# Patient Record
Sex: Male | Born: 1980 | Race: White | Hispanic: No | Marital: Married | Smoking: Never smoker
Health system: Southern US, Community
[De-identification: ages and names within clinical notes are randomized; demographics above are authoritative.]

## PROBLEM LIST (undated history)

## (undated) DIAGNOSIS — M25569 Pain in unspecified knee: Principal | ICD-10-CM

## (undated) DIAGNOSIS — G8929 Other chronic pain: Secondary | ICD-10-CM

## (undated) DIAGNOSIS — F419 Anxiety disorder, unspecified: Secondary | ICD-10-CM

## (undated) DIAGNOSIS — M542 Cervicalgia: Secondary | ICD-10-CM

## (undated) DIAGNOSIS — R200 Anesthesia of skin: Secondary | ICD-10-CM

## (undated) DIAGNOSIS — F1121 Opioid dependence, in remission: Secondary | ICD-10-CM

## (undated) HISTORY — DX: Anesthesia of skin: R20.0

## (undated) HISTORY — PX: KNEE SURGERY: SHX244

## (undated) HISTORY — DX: Pain in unspecified knee: M25.569

## (undated) HISTORY — DX: Cervicalgia: M54.2

## (undated) HISTORY — DX: Opioid dependence, in remission: F11.21

## (undated) HISTORY — DX: Anxiety disorder, unspecified: F41.9

## (undated) HISTORY — DX: Other chronic pain: G89.29

## (undated) HISTORY — PX: INNER EAR SURGERY: SHX679

---

## 1999-03-09 ENCOUNTER — Emergency Department (HOSPITAL_COMMUNITY): Admission: EM | Admit: 1999-03-09 | Discharge: 1999-03-09 | Payer: Self-pay

## 2001-10-15 ENCOUNTER — Ambulatory Visit: Admission: RE | Admit: 2001-10-15 | Discharge: 2001-10-15 | Payer: Self-pay | Admitting: Family Medicine

## 2001-10-16 ENCOUNTER — Ambulatory Visit (HOSPITAL_COMMUNITY): Admission: RE | Admit: 2001-10-16 | Discharge: 2001-10-16 | Payer: Self-pay | Admitting: Family Medicine

## 2004-10-15 ENCOUNTER — Emergency Department (HOSPITAL_COMMUNITY): Admission: EM | Admit: 2004-10-15 | Discharge: 2004-10-15 | Payer: Self-pay | Admitting: Emergency Medicine

## 2004-12-25 ENCOUNTER — Ambulatory Visit (HOSPITAL_COMMUNITY): Admission: RE | Admit: 2004-12-25 | Discharge: 2004-12-25 | Payer: Self-pay | Admitting: Neurosurgery

## 2006-04-02 ENCOUNTER — Emergency Department (HOSPITAL_COMMUNITY): Admission: EM | Admit: 2006-04-02 | Discharge: 2006-04-02 | Payer: Self-pay | Admitting: Family Medicine

## 2006-10-31 ENCOUNTER — Emergency Department (HOSPITAL_COMMUNITY): Admission: EM | Admit: 2006-10-31 | Discharge: 2006-10-31 | Payer: Self-pay | Admitting: Family Medicine

## 2009-09-02 ENCOUNTER — Emergency Department (HOSPITAL_COMMUNITY): Admission: EM | Admit: 2009-09-02 | Discharge: 2009-09-02 | Payer: Self-pay | Admitting: Emergency Medicine

## 2009-09-02 ENCOUNTER — Emergency Department (HOSPITAL_COMMUNITY): Admission: EM | Admit: 2009-09-02 | Discharge: 2009-09-03 | Payer: Self-pay | Admitting: Emergency Medicine

## 2010-09-25 ENCOUNTER — Ambulatory Visit
Admission: RE | Admit: 2010-09-25 | Discharge: 2010-09-25 | Disposition: A | Discharge status: Home / Self Care | Payer: Self-pay | Source: Ambulatory Visit | Attending: Pain Medicine | Admitting: Pain Medicine

## 2010-09-25 ENCOUNTER — Other Ambulatory Visit: Payer: Self-pay | Admitting: Pain Medicine

## 2010-09-25 DIAGNOSIS — M25569 Pain in unspecified knee: Secondary | ICD-10-CM

## 2011-06-03 ENCOUNTER — Ambulatory Visit (INDEPENDENT_AMBULATORY_CARE_PROVIDER_SITE_OTHER): Payer: BC Managed Care – PPO | Admitting: Family Medicine

## 2011-06-03 ENCOUNTER — Encounter: Payer: Self-pay | Admitting: Family Medicine

## 2011-06-03 VITALS — BP 126/82 | HR 74 | Temp 98.0°F | Ht 69.5 in | Wt 178.0 lb

## 2011-06-03 DIAGNOSIS — F411 Generalized anxiety disorder: Secondary | ICD-10-CM

## 2011-06-03 DIAGNOSIS — M25569 Pain in unspecified knee: Secondary | ICD-10-CM

## 2011-06-03 DIAGNOSIS — G8929 Other chronic pain: Secondary | ICD-10-CM | POA: Insufficient documentation

## 2011-06-03 DIAGNOSIS — F419 Anxiety disorder, unspecified: Secondary | ICD-10-CM | POA: Insufficient documentation

## 2011-06-03 MED ORDER — METHADONE HCL 10 MG/5ML PO SOLN: 120.0000 mg | Freq: Every day | ORAL | Status: AC

## 2011-06-03 NOTE — Progress Notes (Signed)
  Subjective:    Patient ID: Luke [name], male    DOB: Feb 01, 1981, 31 y.o.   MRN: [id number]  HPI 31 yr old male to establish with [place] who asks advice about his chronic knee pain. He played soccer while attending [employer], and he sustained some major injuries to the left knee. He had torn cartilage and some sort of fracture that left bone fragments in the joint space. He had 2 surgeries per Dr. Eulah Pont to repair the cartilage tears and to remove the bone fragments. This was only partly successful, however, and he has dealt with chronic severe pain in the knee ever since. He saw several different orthopedic doctors and several pain specialists, but nothing worked very well. He ended up at the [facility] clinic, and he has been going there every month for methadone for the past 7 years. He gets reasonable pain control with this, but he cannot afford the $400 a month fee that he must pay to attend this clinic. He must pay cash for this. He asks if we can suggest a place to go where he could use his insurance.    Review of Systems  Constitutional: Negative.   Respiratory: Negative.   Cardiovascular: Negative.   Musculoskeletal: Positive for arthralgias.       Objective:   Physical Exam  Constitutional: He appears well-developed and well-nourished.  Neck: No thyromegaly present.  Cardiovascular: Normal rate, regular rhythm, normal heart sounds and intact distal pulses.   Pulmonary/Chest: Effort normal and breath sounds normal.  Musculoskeletal: Normal range of motion. He exhibits no edema and no tenderness.  Lymphadenopathy:    He has no cervical adenopathy.          Assessment & Plan:  Chronic knee pain which has required the use of methadone for some time. We will try to get him into another pain clinic that can help him at a lower cost.

## 2011-07-29 ENCOUNTER — Telehealth: Payer: Self-pay | Admitting: Family Medicine

## 2011-07-29 DIAGNOSIS — G8929 Other chronic pain: Secondary | ICD-10-CM

## 2011-07-29 NOTE — Telephone Encounter (Signed)
Patient called stating that he was told by the MD if the PM clinic did not prescribed the same med he was on to call back and he could be referred elsewhere. Patient would like to be referred to a different clinic. Please assist.

## 2011-07-30 NOTE — Telephone Encounter (Signed)
Another referral was done, Luke [name] will call him

## 2011-07-31 NOTE — Telephone Encounter (Signed)
Spoke with pt

## 2012-04-19 ENCOUNTER — Telehealth: Payer: Self-pay

## 2012-04-19 NOTE — Telephone Encounter (Signed)
Millie rn advised

## 2012-04-19 NOTE — Telephone Encounter (Signed)
Not my pt.Luke [name] it is Dr. Claris Che pt. Have [facility] know to contact them.  I will forward this as well.

## 2012-04-19 NOTE — Telephone Encounter (Signed)
Millie RN at Dr Sherre Lain with [facility] treatment center left v/m; has Dr Ermalene Searing as listed as prescribing Alprazolam for pt. Pt gets Methadone at [facility] treatment center. Millie request if Dr Ermalene Searing is continuing to order Alprazolam to consider changing to different med for anxiety; Benzos are not compatible with Methadone and on occasion can cause death. Request call back between 5 am - 2 pm.`

## 2012-04-19 NOTE — Telephone Encounter (Signed)
Noted. I have never given him Alprazolam either, nor will I if he asks me to. He can follow up prn

## 2013-09-23 ENCOUNTER — Ambulatory Visit (INDEPENDENT_AMBULATORY_CARE_PROVIDER_SITE_OTHER): Payer: BC Managed Care – PPO | Admitting: Family Medicine

## 2013-09-23 VITALS — BP 120/82 | HR 58 | Temp 97.9°F | Resp 14 | Ht 68.0 in | Wt 164.8 lb

## 2013-09-23 DIAGNOSIS — L237 Allergic contact dermatitis due to plants, except food: Secondary | ICD-10-CM

## 2013-09-23 DIAGNOSIS — L255 Unspecified contact dermatitis due to plants, except food: Secondary | ICD-10-CM

## 2013-09-23 MED ORDER — PREDNISONE 20 MG PO TABS: ORAL_TABLET | ORAL | Status: DC

## 2013-09-23 MED ORDER — METHYLPREDNISOLONE ACETATE 80 MG/ML IJ SUSP
80.0000 mg | Freq: Once | INTRAMUSCULAR | Status: AC
  Administered 2013-09-23: 80 mg via INTRAMUSCULAR

## 2013-09-23 MED ORDER — TRIAMCINOLONE ACETONIDE 0.1 % EX CREA: 1.0000 "application " | TOPICAL_CREAM | Freq: Two times a day (BID) | CUTANEOUS | Status: DC

## 2013-09-23 NOTE — Patient Instructions (Signed)
Wash well  Take over-the-counter Zyrtec for itching  Use topical triamcinolone cream on the bed areas  Take prednisone 3 tablets daily for 2 days, 2 daily for 2 days, then one daily for 2 days  Return if further concerns

## 2013-09-23 NOTE — Progress Notes (Signed)
Subjective: 33 year old man who works at [employer]. Thursday morning he was working outside at home. He now set has poison ivy breaking out on both forearms. It is not gone elsewhere. He is otherwise healthy. He takes Xanax. He is not diabetic.  Objective: Confluent areas of contact dermatitis on his forearms, especially the distal half of the forearm. On the left side there is a little raised bump area in the middle of the erythematous area. He wondered whether he got an air.  Assessment: Contact dermatitis  Plan: Wash well Depo-Medrol 80 Prednisone 20 mg, 3, 3, 2, 2, 1, 1 Zyrtec as needed for itch

## 2014-03-15 ENCOUNTER — Other Ambulatory Visit (INDEPENDENT_AMBULATORY_CARE_PROVIDER_SITE_OTHER): Payer: BC Managed Care – PPO

## 2014-03-15 DIAGNOSIS — Z0000 Encounter for general adult medical examination without abnormal findings: Secondary | ICD-10-CM

## 2014-03-15 LAB — BASIC METABOLIC PANEL
BUN: 24 mg/dL — ABNORMAL HIGH (ref 6–23)
CHLORIDE: 104 meq/L (ref 96–112)
CO2: 26 mEq/L (ref 19–32)
Calcium: 9.7 mg/dL (ref 8.4–10.5)
Creatinine, Ser: 1.1 mg/dL (ref 0.4–1.5)
GFR: 82.1 mL/min (ref >60.00)
Glucose, Bld: 86 mg/dL (ref 70–99)
POTASSIUM: 4.4 meq/L (ref 3.5–5.1)
Sodium: 140 mEq/L (ref 135–145)

## 2014-03-15 LAB — HEPATIC FUNCTION PANEL
ALT: 23 U/L (ref 0–53)
AST: 25 U/L (ref 0–37)
Albumin: 4.6 g/dL (ref 3.5–5.2)
Alkaline Phosphatase: 41 U/L (ref 39–117)
BILIRUBIN DIRECT: 0 mg/dL (ref 0.0–0.3)
BILIRUBIN TOTAL: 0.7 mg/dL (ref 0.2–1.2)
TOTAL PROTEIN: 7.7 g/dL (ref 6.0–8.3)

## 2014-03-15 LAB — POCT URINALYSIS DIPSTICK
BILIRUBIN UA: NEGATIVE
Glucose, UA: NEGATIVE
KETONES UA: NEGATIVE
LEUKOCYTES UA: NEGATIVE
Nitrite, UA: NEGATIVE
Protein, UA: NEGATIVE
RBC UA: NEGATIVE
Spec Grav, UA: 1.015
Urobilinogen, UA: 0.2
pH, UA: 6

## 2014-03-15 LAB — CBC WITH DIFFERENTIAL/PLATELET
BASOS PCT: 0.4 % (ref 0.0–3.0)
Basophils Absolute: 0 10*3/uL (ref 0.0–0.1)
Eosinophils Absolute: 0.2 10*3/uL (ref 0.0–0.7)
Eosinophils Relative: 3 % (ref 0.0–5.0)
HEMATOCRIT: 43.2 % (ref 39.0–52.0)
Hemoglobin: 14.5 g/dL (ref 13.0–17.0)
LYMPHS ABS: 2.1 10*3/uL (ref 0.7–4.0)
Lymphocytes Relative: 36.6 % (ref 12.0–46.0)
MCHC: 33.5 g/dL (ref 30.0–36.0)
MCV: 99 fl (ref 78.0–100.0)
MONO ABS: 0.5 10*3/uL (ref 0.1–1.0)
Monocytes Relative: 8.7 % (ref 3.0–12.0)
NEUTROS PCT: 51.3 % (ref 43.0–77.0)
Neutro Abs: 2.9 10*3/uL (ref 1.4–7.7)
Platelets: 235 10*3/uL (ref 150.0–400.0)
RBC: 4.37 Mil/uL (ref 4.22–5.81)
RDW: 13.2 % (ref 11.5–15.5)
WBC: 5.7 10*3/uL (ref 4.0–10.5)

## 2014-03-15 LAB — LIPID PANEL
CHOL/HDL RATIO: 3
Cholesterol: 134 mg/dL (ref 0–200)
HDL: 51.6 mg/dL (ref >39.00)
LDL CALC: 71 mg/dL (ref 0–99)
NonHDL: 82.4
Triglycerides: 59 mg/dL (ref 0.0–149.0)
VLDL: 11.8 mg/dL (ref 0.0–40.0)

## 2014-03-15 LAB — TSH: TSH: 1.92 u[IU]/mL (ref 0.35–4.50)

## 2014-03-21 ENCOUNTER — Ambulatory Visit (INDEPENDENT_AMBULATORY_CARE_PROVIDER_SITE_OTHER): Payer: BC Managed Care – PPO | Admitting: Family Medicine

## 2014-03-21 ENCOUNTER — Encounter: Payer: Self-pay | Admitting: Family Medicine

## 2014-03-21 VITALS — BP 143/72 | HR 69 | Temp 97.8°F | Ht 68.0 in | Wt 160.0 lb

## 2014-03-21 DIAGNOSIS — Z0000 Encounter for general adult medical examination without abnormal findings: Secondary | ICD-10-CM

## 2014-03-21 NOTE — Progress Notes (Signed)
Pre visit review using our clinic review tool, if applicable. No additional management support is needed unless otherwise documented below in the visit note. 

## 2014-03-21 NOTE — Progress Notes (Signed)
   Subjective:    Patient ID: Luke [name], male    DOB: [birth year], 33 y.o.   MRN: [id number]  HPI 33 yr old male for a cpx. He feels well except he does describe some intermittent aches and pains in the right arm and right leg. These comes 2 or 3 times a week and last only minutes at a time. No swelling or numbness or weakness. No neurologic changes. He sees Dr. Evelene CroonKaur for management of his anxiety and she has helped him wean off narcotic meds. He is totally off Methadone now and is taking Zubsolv instead. He uses a combination of Librium and Xanax for the anxiety.   Review of Systems  Constitutional: Negative.   HENT: Negative.   Eyes: Negative.   Respiratory: Negative.   Cardiovascular: Negative.   Gastrointestinal: Negative.   Genitourinary: Negative.   Musculoskeletal: Positive for myalgias. Negative for back pain, joint swelling, arthralgias, gait problem, neck pain and neck stiffness.  Skin: Negative.   Neurological: Negative.   Psychiatric/Behavioral: Negative for suicidal ideas, hallucinations, behavioral problems, confusion, sleep disturbance, self-injury, dysphoric mood, decreased concentration and agitation. The patient is nervous/anxious. The patient is not hyperactive.        Objective:   Physical Exam  Constitutional: He is oriented to person, place, and time. He appears well-developed and well-nourished. No distress.  HENT:  Head: Normocephalic and atraumatic.  Right Ear: External ear normal.  Left Ear: External ear normal.  Nose: Nose normal.  Mouth/Throat: Oropharynx is clear and moist. No oropharyngeal exudate.  Eyes: Conjunctivae and EOM are normal. Pupils are equal, round, and reactive to light. Right eye exhibits no discharge. Left eye exhibits no discharge. No scleral icterus.  Neck: Neck supple. No JVD present. No tracheal deviation present. No thyromegaly present.  Cardiovascular: Normal rate, regular rhythm, normal heart sounds and intact distal pulses.   Exam reveals no gallop and no friction rub.   No murmur heard. Pulmonary/Chest: Effort normal and breath sounds normal. No respiratory distress. He has no wheezes. He has no rales. He exhibits no tenderness.  Abdominal: Soft. Bowel sounds are normal. He exhibits no distension and no mass. There is no tenderness. There is no rebound and no guarding.  Genitourinary: Rectum normal, prostate normal and penis normal. Guaiac negative stool. No penile tenderness.  Musculoskeletal: Normal range of motion. He exhibits no edema or tenderness.  Lymphadenopathy:    He has no cervical adenopathy.  Neurological: He is alert and oriented to person, place, and time. He has normal reflexes. No cranial nerve deficit. He exhibits normal muscle tone. Coordination normal.  Skin: Skin is warm and dry. No rash noted. He is not diaphoretic. No erythema. No pallor.  Psychiatric: He has a normal mood and affect. His behavior is normal. Judgment and thought content normal.          Assessment & Plan:  Well exam. I am not sure how to explain his intermittent unilateral myalgias. Try taking Aleve prn.

## 2014-04-19 ENCOUNTER — Telehealth: Payer: Self-pay | Admitting: Family Medicine

## 2014-04-19 DIAGNOSIS — R0981 Nasal congestion: Secondary | ICD-10-CM

## 2014-04-19 NOTE — Telephone Encounter (Signed)
I left message with this information. 

## 2014-04-19 NOTE — Telephone Encounter (Signed)
Referral was done  

## 2014-04-19 NOTE — Telephone Encounter (Signed)
Wife called to ask for if pt could get a referral to an ENT. She states pt has trouble breathing at night and was told he would need surgery to help w/ this issue.   She feels like ENT is the best way to go.  Does pt need referral to ent?

## 2014-05-14 ENCOUNTER — Encounter: Payer: Self-pay | Admitting: Family Medicine

## 2014-05-14 ENCOUNTER — Ambulatory Visit (INDEPENDENT_AMBULATORY_CARE_PROVIDER_SITE_OTHER): Payer: BLUE CROSS/BLUE SHIELD | Admitting: Family Medicine

## 2014-05-14 VITALS — BP 135/74 | HR 57 | Temp 98.3°F | Ht 68.0 in | Wt 156.0 lb

## 2014-05-14 DIAGNOSIS — M542 Cervicalgia: Secondary | ICD-10-CM

## 2014-05-14 NOTE — Progress Notes (Signed)
Pre visit review using our clinic review tool, if applicable. No additional management support is needed unless otherwise documented below in the visit note. 

## 2014-05-14 NOTE — Progress Notes (Signed)
   Subjective:    Patient ID: Luke [name], male    DOB: 11/02/1980, 34 y.o.   MRN: [id number]  HPI Here to follow up on a 2 month hx of intermittent weakness and feeling "cold" in the right arm and right leg. There is no pain per se. He does have some pain and stiffness in the right side of the neck and upper back.    Review of Systems  Musculoskeletal: Positive for neck pain and neck stiffness. Negative for gait problem.  Neurological: Positive for weakness and numbness. Negative for dizziness, tremors, seizures, syncope, facial asymmetry, speech difficulty, light-headedness and headaches.       Objective:   Physical Exam  Constitutional: He appears well-developed and well-nourished.  Musculoskeletal:  Mildly tender in the right neck and trapezeus area. Full ROM          Assessment & Plan:  He seems to have tight muscles in the neck which are impinging on cervical nerves. We will set up some PT for him

## 2014-05-17 ENCOUNTER — Ambulatory Visit: Payer: Self-pay | Admitting: Family Medicine

## 2014-06-07 ENCOUNTER — Ambulatory Visit: Payer: BLUE CROSS/BLUE SHIELD | Admitting: Physical Therapy

## 2014-07-05 ENCOUNTER — Encounter: Payer: Self-pay | Admitting: Neurology

## 2014-07-05 ENCOUNTER — Ambulatory Visit (INDEPENDENT_AMBULATORY_CARE_PROVIDER_SITE_OTHER): Payer: BLUE CROSS/BLUE SHIELD | Admitting: Neurology

## 2014-07-05 VITALS — BP 152/78 | HR 80 | Resp 18 | Ht 69.0 in | Wt 151.0 lb

## 2014-07-05 DIAGNOSIS — R202 Paresthesia of skin: Secondary | ICD-10-CM | POA: Diagnosis not present

## 2014-07-05 DIAGNOSIS — M791 Myalgia, unspecified site: Secondary | ICD-10-CM

## 2014-07-05 DIAGNOSIS — M79601 Pain in right arm: Secondary | ICD-10-CM

## 2014-07-05 DIAGNOSIS — M79604 Pain in right leg: Secondary | ICD-10-CM | POA: Diagnosis not present

## 2014-07-05 DIAGNOSIS — J310 Chronic rhinitis: Secondary | ICD-10-CM | POA: Diagnosis not present

## 2014-07-05 DIAGNOSIS — R0981 Nasal congestion: Secondary | ICD-10-CM

## 2014-07-05 DIAGNOSIS — T485X5A Adverse effect of other anti-common-cold drugs, initial encounter: Secondary | ICD-10-CM

## 2014-07-05 NOTE — Patient Instructions (Signed)
Your neurological exam looks reassuringly good. We will look further for a cause.  We will do blood today and call you back.  We will schedule you for a neck MRI and call you with the results.  We will consider Physical therapy down the road and if needed, do a brain scan if needed.

## 2014-07-05 NOTE — Progress Notes (Signed)
Subjective:    Patient ID: Luke [name] is a 34 y.o. male.  HPI     Huston Foley, MD, PhD [facility] [address], [address] [address] [place], [place] [address]  Dear Dr. Renne Crigler,   I saw your patient, Luke [name], upon your kind request in my neurologic clinic today for initial consultation of his right-sided numbness. The patient is unaccompanied today. As you know, Luke [name] is a 34 year old right-handed gentleman with an underlying medical history of anxiety and narcotic pain medication addiction, previously on methadone, now on Zubsolv and followed by Dr. Evelene Croon in psychiatry, who reports an approximately four-month history of right-sided numbness affecting his upper and lower extremity along with tingling, no weakness. He has had some neck pain and also pain radiating to his arm and right leg. He has no left-sided symptoms. He has not noticed any fine motor dyscontrol. He denies any headaches. He has no facial symptoms with the exception of right jaw tingling one time or so. Symptoms started gradually. He recalls that symptoms started after he came off of methadone. He's not sure if that is connected. He has no family history of neuromuscular diseases. He does not believe his symptoms are progressive. He has seen Dr. Clent Ridges for this at [facility] primary care as well. I reviewed your office note from 06/21/2014 which you kindly included as well as Dr. Claris Che office notes from 03/21/2014 as well as 05/14/2014. He was referred to physical therapy. He canceled his physical therapy appointment on 06/07/2014. He has not had any recent neck imaging studies. He had a cervical spine MRI without contrast on 12/29/2004 which I reviewed: IMPRESSION:   1. 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   2. No evidence of vertebral body compression fracture, subluxation or cord injury.    In addition, personally reviewed the  images through the PACS system. Upon my review there were minimal degenerative changes, mild disc bulge at C5-6. Of note, he had a head CT without contrast for her complaint of right-sided weakness on 09/03/2009 which I reviewed: This was reported as normal. In addition, personally reviewed the images through the PACS system and findings were negative for any acute changes upon my review. He had blood work on 03/15/2014 which I reviewed: Lipid profile was unremarkable, CBC and CMP were unremarkable, TSH was normal.  His Past Medical History Is Significant For: Past Medical History  Diagnosis Date  . Anxiety     sees Dr. Milagros Evener   . Chronic knee pain     sees [employer] (methadone clinic)  . Neck pain   . Numbness on right side   . History of narcotic addiction     His Past Surgical History Is Significant For: Past Surgical History  Procedure Laterality Date  . Knee surgery  1999 and 2000    per Dr. Richardson Landry     His Family History Is Significant For: Family History  Problem Relation Age of Onset  . Depression Father   . Bipolar disorder Mother     His Social History Is Significant For: History   Social History  . Marital Status: Single    Spouse Name: N/A  . Number of Children: 1  . Years of Education: Some colle   Occupational History  . 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 Smoking status: Never Smoker   . Smokeless tobacco: Never Used  .  Alcohol Use: 0.0 oz/week    0 Standard drinks or equivalent per week     Comment: rare  . Drug Use: No  . Sexual Activity: Not on file   Other Topics Concern  . None   Social History Narrative   May have 1 energy drink during day     His Allergies Are:  No Known Allergies:   His Current Medications Are:  Outpatient Encounter Prescriptions as of 07/05/2014  Medication Sig  . ALPRAZolam (XANAX) 1 MG tablet Take 1 mg by mouth daily.   . Buprenorphine HCl-Naloxone HCl (ZUBSOLV) 5.7-1.4 MG SUBL Place  under the tongue 2 (two) times daily.  . chlordiazePOXIDE (LIBRIUM) 25 MG capsule Take 25 mg by mouth 3 (three) times daily as needed for anxiety.  . [DISCONTINUED] ZUBSOLV 11.4-2.9 MG SUBL   :   Review of Systems:  Out of a complete 14 point review of systems, all are reviewed and negative with the exception of these symptoms as listed below:   Review of Systems  Constitutional:       Weight loss  Musculoskeletal:       Aching muscles  Neurological: Positive for weakness.       "Tingling" from R side of neck down into R arm and R leg, started about 3 months ago and seems to get worse with stress  Psychiatric/Behavioral:       Anxiety    Objective:  Neurologic Exam  Physical Exam Physical Examination:   Filed Vitals:   07/05/14 1432  BP: 152/78  Pulse: 80  Resp: 18   General Examination: The patient is a very pleasant 34 y.o. male in no acute distress. He appears well-developed and well-nourished and well groomed. He is mildly anxious appearing.  HEENT: Normocephalic, atraumatic, pupils are equal, round and reactive to light and accommodation. Funduscopic exam is normal with sharp disc margins noted. Extraocular tracking is good without limitation to gaze excursion or nystagmus noted. Normal smooth pursuit is noted. Hearing is grossly intact. Tympanic membranes are clear bilaterally. Face is symmetric with normal facial animation and normal facial sensation. Speech is clear with no dysarthria noted. There is no hypophonia. There is no lip, neck/head, jaw or voice tremor. Neck is supple with full range of passive and active motion. There are no carotid bruits on auscultation. Oropharynx exam reveals: mild mouth dryness, adequate dental hygiene and no significant airway crowding, but he has mild pharyngeal erythema. Speech is nasal sounding. Nasal inspection reveals normal septum deviation and nasal congestion. He does admit to using Afrin daily, more than once a day.   Chest: Clear  to auscultation without wheezing, rhonchi or crackles noted.  Heart: S1+S2+0, regular and normal without murmurs, rubs or gallops noted.   Abdomen: Soft, non-tender and non-distended with normal bowel sounds appreciated on auscultation.  Extremities: There is no pitting edema in the distal lower extremities bilaterally. Pedal pulses are intact.  Skin: Warm and dry without trophic changes noted. There are no varicose veins.  Musculoskeletal: exam reveals no obvious joint deformities, tenderness or joint swelling or erythema.   Neurologically:  Mental status: The patient is awake, alert and oriented in all 4 spheres. His immediate and remote memory, attention, language skills and fund of knowledge are appropriate. There is no evidence of aphasia, agnosia, apraxia or anomia. Speech is clear with normal prosody and enunciation. Thought process is linear. Mood is normal and affect is constricted.  Cranial nerves II - XII are as described above under HEENT  exam. In addition: shoulder shrug is normal with equal shoulder height noted. Motor exam: Normal bulk, strength and tone is noted. There is no drift, tremor or rebound. Romberg is negative. Reflexes are 2+ throughout. Babinski: Toes are flexor bilaterally. Fine motor skills and coordination: intact with normal finger taps, normal hand movements, normal rapid alternating patting, normal foot taps and normal foot agility.  Cerebellar testing: No dysmetria or intention tremor on finger to nose testing. Heel to shin is unremarkable bilaterally. There is no truncal or gait ataxia.  Sensory exam: intact to light touch, pinprick, vibration, temperature sense in the upper and lower extremities.  Gait, station and balance: He stands easily. No veering to one side is noted. No leaning to one side is noted. Posture is age-appropriate and stance is narrow based. Gait shows normal stride length and normal pace. No problems turning are noted. He turns en bloc.  Tandem walk is unremarkable. Intact toe and heel stance is noted.               Assessment and plan:   In summary, Luke [name] is a very pleasant 34 y.o.-year old male with an underlying medical history of anxiety and narcotic pain medication addiction, previously on methadone, now on Zubsolv and followed by Dr. Evelene CroonKaur in psychiatry, who reports an approximately four-month history of right-sided numbness, tingling, pain, affecting both upper and lower extremities. Symptoms have spared his face. He has no recurrent headaches. Thankfully, his physical and neurological exam is completely nonfocal. He has chronic nasal congestion and admits to overusing decongestions. Symptoms coincided with when he stopped using methadone. I reassured him that his neurological exam is normal. Nevertheless, given his longer standing history of symptoms confined to one side and neck pain reported, I would like to proceed with further imaging testing in the form of cervical spine MRI. We will consider a brain MRI down the road if need be. We will consider physical therapy down the road as well. I do not suggest any new medication. We will do further workup in the form of blood work as well, this will include vitamin D, vitamin B12, vitamin B1 and vitamin B6 as well as inflammatory markers  And CK level. We will call him with all his test results. Again, I was able to primarily reassured the patient today but we will look further for possible etiology of his symptoms. We can consider EMG nerve conduction testing down the road as well. At this juncture, I  will see him back routinely after all these tests are done and we will also keep them posted on the phone. I answered all his questions today and he was in agreement. He was advised to keep well hydrated. He was furthermore advised to try to wean off of nasal decongestions.  Thank you very much for allowing me to participate in the care of this nice patient. If I can be of any  further assistance to you please do not hesitate to call me at [phone].  Sincerely,   Huston FoleySaima Keslyn Teater, MD, PhD

## 2014-07-06 ENCOUNTER — Telehealth: Payer: Self-pay

## 2014-07-06 NOTE — Telephone Encounter (Signed)
-----   Message from Huston FoleySaima Athar, MD sent at 07/06/2014  9:34 AM EDT ----- Please call back with his labs are back so far. Some things are pending and we will call if they are abnormal. Vitamin B12 was elevated, indicating that he is possibly taking a supplement for this. Please have him discuss with primary care physician if he needs to take a vitamin B12 supplement. Muscle enzyme was normal, vitamin D was in the normal range but at the very low end of the spectrum. He can consider taking a low dose over-the-counter vitamin D supplements but is encouraged to discuss this with his primary care physician first. He could take something like 1000 units once daily of any vitamin D supplement of his choice. Vitamin B6 and B1 are pending. Inflammatory markers are fine. No other action is required on these tests. Huston FoleySaima Athar, MD, PhD [facility])

## 2014-07-06 NOTE — Progress Notes (Signed)
Quick Note:  Please call back with his labs are back so far. Some things are pending and we will call if they are abnormal. Vitamin B12 was elevated, indicating that he is possibly taking a supplement for this. Please have him discuss with primary care physician if he needs to take a vitamin B12 supplement. Muscle enzyme was normal, vitamin D was in the normal range but at the very low end of the spectrum. He can consider taking a low dose over-the-counter vitamin D supplements but is encouraged to discuss this with his primary care physician first. He could take something like 1000 units once daily of any vitamin D supplement of his choice. Vitamin B6 and B1 are pending. Inflammatory markers are fine. No other action is required on these tests. Huston FoleySaima Lynsie Mcwatters, MD, PhD [facility] ([facility])  ______

## 2014-07-06 NOTE — Telephone Encounter (Signed)
[facility] is [facility] of labs and reports understanding

## 2014-07-08 LAB — VITAMIN B6: VITAMIN B6: 72.5 ug/L — AB (ref 5.3–46.7)

## 2014-07-08 LAB — ANA W/REFLEX: Anti Nuclear Antibody(ANA): NEGATIVE

## 2014-07-08 LAB — CK: Total CK: 89 U/L (ref 24–204)

## 2014-07-08 LAB — SEDIMENTATION RATE: SED RATE: 2 mm/h (ref 0–15)

## 2014-07-08 LAB — B12 AND FOLATE PANEL
Folate: 13.2 ng/mL (ref >3.0)
VITAMIN B 12: 1027 pg/mL — AB (ref 211–946)

## 2014-07-08 LAB — VITAMIN B1: Thiamine: 132.9 nmol/L (ref 66.5–200.0)

## 2014-07-08 LAB — VITAMIN D 25 HYDROXY (VIT D DEFICIENCY, FRACTURES): Vit D, 25-Hydroxy: 30 ng/mL (ref 30.0–100.0)

## 2014-07-12 ENCOUNTER — Ambulatory Visit (INDEPENDENT_AMBULATORY_CARE_PROVIDER_SITE_OTHER): Payer: BLUE CROSS/BLUE SHIELD

## 2014-07-12 DIAGNOSIS — M79604 Pain in right leg: Secondary | ICD-10-CM

## 2014-07-12 DIAGNOSIS — M791 Myalgia, unspecified site: Secondary | ICD-10-CM

## 2014-07-12 DIAGNOSIS — M79601 Pain in right arm: Secondary | ICD-10-CM | POA: Diagnosis not present

## 2014-07-12 DIAGNOSIS — R202 Paresthesia of skin: Secondary | ICD-10-CM

## 2014-07-16 ENCOUNTER — Telehealth: Payer: Self-pay

## 2014-07-16 NOTE — Progress Notes (Signed)
Quick Note:  Please call patient regarding his cervical spine MRI without contrast. While he does have mild degenerative changes along his neck, these are overall mild and not much progressed from 2006 which is 10 years ago of course. These are actually reassuring findings. There is really nothing on his cervical spine MRI to suggest issues to explain his symptoms with regards to his right-sided numbness and pain. At this point, no action is required on the cervical spine MRI. Huston FoleySaima Waylin Dorko, MD, PhD [facility] ([facility])  ______

## 2014-07-16 NOTE — Telephone Encounter (Signed)
Patient is aware of results.

## 2014-07-16 NOTE — Telephone Encounter (Signed)
-----   Message from Huston FoleySaima Athar, MD sent at 07/16/2014  3:11 PM EDT ----- Please call patient regarding his cervical spine MRI without contrast. While he does have mild degenerative changes along his neck, these are overall mild and not much progressed from 2006 which is 10 years ago of course. These are actually reassuring findings. There is really nothing on his cervical spine MRI to suggest issues to explain his symptoms with regards to his right-sided numbness and pain. At this point, no action is required on the cervical spine MRI. Huston FoleySaima Athar, MD, PhD [facility])

## 2014-07-16 NOTE — Telephone Encounter (Signed)
[name] and [facility] is [facility] of results. He is requesting for something else to be done. I stated that in his last visit Dr. Frances FurbishAthar suggested PT but he said he didn't want to do that right now. I also mentioned possible NCS (in last note) but he said he would think about the options and call us back.

## 2014-07-16 NOTE — Telephone Encounter (Signed)
-----   Message from Huston FoleySaima Athar, MD sent at 07/16/2014  3:15 PM EDT ----- Please call patient with an update on his blood work. Vitamin B6 also came back higher than normal, also most likely indicating that he is taking a supplement for vitamin D. Please have him talk to his primary care physician about vitamin B 12 and vitamin B6 supplementation and whether he needs to take a supplement for these. Huston FoleySaima Athar, MD, PhD [facility])

## 2014-07-16 NOTE — Progress Notes (Signed)
Quick Note:  Please call patient with an update on his blood work. Vitamin B6 also came back higher than normal, also most likely indicating that he is taking a supplement for vitamin D. Please have him talk to his primary care physician about vitamin B 12 and vitamin B6 supplementation and whether he needs to take a supplement for these. Huston FoleySaima Nyazia Canevari, MD, PhD [facility] ([facility])  ______

## 2014-10-04 ENCOUNTER — Ambulatory Visit: Payer: BLUE CROSS/BLUE SHIELD | Admitting: Neurology

## 2015-03-17 ENCOUNTER — Inpatient Hospital Stay (HOSPITAL_COMMUNITY)
Admission: EM | Admit: 2015-03-17 | Discharge: 2015-03-24 | DRG: 871 | Disposition: A | Discharge status: Home / Self Care | Payer: BLUE CROSS/BLUE SHIELD | Attending: Internal Medicine | Admitting: Internal Medicine

## 2015-03-17 ENCOUNTER — Ambulatory Visit (INDEPENDENT_AMBULATORY_CARE_PROVIDER_SITE_OTHER): Payer: BLUE CROSS/BLUE SHIELD | Admitting: Family Medicine

## 2015-03-17 ENCOUNTER — Ambulatory Visit (INDEPENDENT_AMBULATORY_CARE_PROVIDER_SITE_OTHER): Payer: BLUE CROSS/BLUE SHIELD

## 2015-03-17 ENCOUNTER — Encounter (HOSPITAL_COMMUNITY): Payer: Self-pay | Admitting: Emergency Medicine

## 2015-03-17 VITALS — BP 130/60 | HR 114 | Temp 99.1°F | Resp 18 | Ht 70.0 in | Wt 162.2 lb

## 2015-03-17 DIAGNOSIS — Z79899 Other long term (current) drug therapy: Secondary | ICD-10-CM

## 2015-03-17 DIAGNOSIS — J1108 Influenza due to unidentified influenza virus with specified pneumonia: Secondary | ICD-10-CM | POA: Diagnosis present

## 2015-03-17 DIAGNOSIS — R059 Cough, unspecified: Secondary | ICD-10-CM

## 2015-03-17 DIAGNOSIS — R509 Fever, unspecified: Secondary | ICD-10-CM

## 2015-03-17 DIAGNOSIS — A419 Sepsis, unspecified organism: Principal | ICD-10-CM | POA: Diagnosis present

## 2015-03-17 DIAGNOSIS — E876 Hypokalemia: Secondary | ICD-10-CM | POA: Diagnosis present

## 2015-03-17 DIAGNOSIS — F1120 Opioid dependence, uncomplicated: Secondary | ICD-10-CM | POA: Diagnosis present

## 2015-03-17 DIAGNOSIS — E872 Acidosis: Secondary | ICD-10-CM | POA: Diagnosis present

## 2015-03-17 DIAGNOSIS — F419 Anxiety disorder, unspecified: Secondary | ICD-10-CM | POA: Diagnosis present

## 2015-03-17 DIAGNOSIS — J9601 Acute respiratory failure with hypoxia: Secondary | ICD-10-CM | POA: Diagnosis present

## 2015-03-17 DIAGNOSIS — G9340 Encephalopathy, unspecified: Secondary | ICD-10-CM | POA: Diagnosis present

## 2015-03-17 DIAGNOSIS — J189 Pneumonia, unspecified organism: Secondary | ICD-10-CM | POA: Diagnosis present

## 2015-03-17 DIAGNOSIS — R519 Headache, unspecified: Secondary | ICD-10-CM

## 2015-03-17 DIAGNOSIS — M25569 Pain in unspecified knee: Secondary | ICD-10-CM | POA: Diagnosis present

## 2015-03-17 DIAGNOSIS — E871 Hypo-osmolality and hyponatremia: Secondary | ICD-10-CM | POA: Diagnosis present

## 2015-03-17 DIAGNOSIS — R05 Cough: Secondary | ICD-10-CM

## 2015-03-17 DIAGNOSIS — R6520 Severe sepsis without septic shock: Secondary | ICD-10-CM | POA: Diagnosis present

## 2015-03-17 DIAGNOSIS — A403 Sepsis due to Streptococcus pneumoniae: Secondary | ICD-10-CM | POA: Diagnosis not present

## 2015-03-17 DIAGNOSIS — D649 Anemia, unspecified: Secondary | ICD-10-CM | POA: Diagnosis present

## 2015-03-17 DIAGNOSIS — G8929 Other chronic pain: Secondary | ICD-10-CM | POA: Diagnosis present

## 2015-03-17 DIAGNOSIS — E860 Dehydration: Secondary | ICD-10-CM | POA: Diagnosis present

## 2015-03-17 DIAGNOSIS — R740 Nonspecific elevation of levels of transaminase and lactic acid dehydrogenase [LDH]: Secondary | ICD-10-CM | POA: Diagnosis not present

## 2015-03-17 DIAGNOSIS — J9600 Acute respiratory failure, unspecified whether with hypoxia or hypercapnia: Secondary | ICD-10-CM | POA: Diagnosis present

## 2015-03-17 DIAGNOSIS — R739 Hyperglycemia, unspecified: Secondary | ICD-10-CM | POA: Diagnosis not present

## 2015-03-17 DIAGNOSIS — R51 Headache: Secondary | ICD-10-CM

## 2015-03-17 DIAGNOSIS — J129 Viral pneumonia, unspecified: Secondary | ICD-10-CM | POA: Diagnosis present

## 2015-03-17 DIAGNOSIS — J90 Pleural effusion, not elsewhere classified: Secondary | ICD-10-CM | POA: Diagnosis not present

## 2015-03-17 DIAGNOSIS — R918 Other nonspecific abnormal finding of lung field: Secondary | ICD-10-CM | POA: Diagnosis not present

## 2015-03-17 DIAGNOSIS — I5021 Acute systolic (congestive) heart failure: Secondary | ICD-10-CM | POA: Diagnosis present

## 2015-03-17 DIAGNOSIS — J9811 Atelectasis: Secondary | ICD-10-CM | POA: Diagnosis present

## 2015-03-17 DIAGNOSIS — R0600 Dyspnea, unspecified: Secondary | ICD-10-CM | POA: Diagnosis not present

## 2015-03-17 LAB — BASIC METABOLIC PANEL WITH GFR
Anion gap: 8 (ref 5–15)
BUN: 18 mg/dL (ref 6–20)
CO2: 30 mmol/L (ref 22–32)
Calcium: 8.7 mg/dL — ABNORMAL LOW (ref 8.9–10.3)
Chloride: 97 mmol/L — ABNORMAL LOW (ref 101–111)
Creatinine, Ser: 1.1 mg/dL (ref 0.61–1.24)
GFR calc Af Amer: >60 mL/min
GFR calc non Af Amer: >60 mL/min
Glucose, Bld: 136 mg/dL — ABNORMAL HIGH (ref 65–99)
Potassium: 3.7 mmol/L (ref 3.5–5.1)
Sodium: 135 mmol/L (ref 135–145)

## 2015-03-17 LAB — I-STAT CG4 LACTIC ACID, ED: Lactic Acid, Venous: 3.01 mmol/L (ref 0.5–2.0)

## 2015-03-17 LAB — I-STAT ARTERIAL BLOOD GAS, ED
Acid-Base Excess: 3 mmol/L — ABNORMAL HIGH (ref 0.0–2.0)
Bicarbonate: 26.2 meq/L — ABNORMAL HIGH (ref 20.0–24.0)
O2 Saturation: 90 %
Patient temperature: 101.6
TCO2: 27 mmol/L (ref 0–100)
pCO2 arterial: 37.2 mmHg (ref 35.0–45.0)
pH, Arterial: 7.462 — ABNORMAL HIGH (ref 7.350–7.450)
pO2, Arterial: 61 mmHg — ABNORMAL LOW (ref 80.0–100.0)

## 2015-03-17 LAB — COMPREHENSIVE METABOLIC PANEL
ALBUMIN: 2.7 g/dL — AB (ref 3.5–5.0)
ALT: 41 U/L (ref 17–63)
ANION GAP: 8 (ref 5–15)
AST: 33 U/L (ref 15–41)
Alkaline Phosphatase: 55 U/L (ref 38–126)
BUN: 17 mg/dL (ref 6–20)
CHLORIDE: 99 mmol/L — AB (ref 101–111)
CO2: 26 mmol/L (ref 22–32)
Calcium: 7.8 mg/dL — ABNORMAL LOW (ref 8.9–10.3)
Creatinine, Ser: 1.06 mg/dL (ref 0.61–1.24)
GFR calc non Af Amer: >60 mL/min (ref >60)
GLUCOSE: 158 mg/dL — AB (ref 65–99)
POTASSIUM: 3.3 mmol/L — AB (ref 3.5–5.1)
Sodium: 133 mmol/L — ABNORMAL LOW (ref 135–145)
Total Bilirubin: 0.6 mg/dL (ref 0.3–1.2)
Total Protein: 5.7 g/dL — ABNORMAL LOW (ref 6.5–8.1)

## 2015-03-17 LAB — POCT CBC
Granulocyte percent: 91.1 %G — AB (ref 37–80)
HCT, POC: 41.3 % — AB (ref 43.5–53.7)
Hemoglobin: 14.6 g/dL (ref 14.1–18.1)
Lymph, poc: 0.6 (ref 0.6–3.4)
MCH, POC: 34.5 pg — AB (ref 27–31.2)
MCHC: 35.5 g/dL — AB (ref 31.8–35.4)
MCV: 97.3 fL — AB (ref 80–97)
MID (cbc): 0.4 (ref 0–0.9)
MPV: 7.6 fL (ref 0–99.8)
POC Granulocyte: 10.2 — AB (ref 2–6.9)
POC LYMPH PERCENT: 5.2 %L — AB (ref 10–50)
POC MID %: 3.7 %M (ref 0–12)
Platelet Count, POC: 173 10*3/uL (ref 142–424)
RBC: 4.24 M/uL — AB (ref 4.69–6.13)
RDW, POC: 13.1 %
WBC: 11.2 10*3/uL — AB (ref 4.6–10.2)

## 2015-03-17 LAB — CBC WITH DIFFERENTIAL/PLATELET
Basophils Absolute: 0 K/uL (ref 0.0–0.1)
Basophils Relative: 0 %
Eosinophils Absolute: 0 K/uL (ref 0.0–0.7)
Eosinophils Relative: 0 %
HCT: 41.1 % (ref 39.0–52.0)
Hemoglobin: 14 g/dL (ref 13.0–17.0)
Lymphocytes Relative: 6 %
Lymphs Abs: 0.6 K/uL — ABNORMAL LOW (ref 0.7–4.0)
MCH: 33.4 pg (ref 26.0–34.0)
MCHC: 34.1 g/dL (ref 30.0–36.0)
MCV: 98.1 fL (ref 78.0–100.0)
Monocytes Absolute: 0.4 K/uL (ref 0.1–1.0)
Monocytes Relative: 5 %
Neutro Abs: 8.8 K/uL — ABNORMAL HIGH (ref 1.7–7.7)
Neutrophils Relative %: 89 %
Platelets: 178 K/uL (ref 150–400)
RBC: 4.19 MIL/uL — ABNORMAL LOW (ref 4.22–5.81)
RDW: 12.9 % (ref 11.5–15.5)
WBC: 9.8 K/uL (ref 4.0–10.5)

## 2015-03-17 LAB — PROTIME-INR
INR: 1.32 (ref 0.00–1.49)
PROTHROMBIN TIME: 16.5 s — AB (ref 11.6–15.2)

## 2015-03-17 LAB — INFLUENZA PANEL BY PCR (TYPE A & B)
H1N1 flu by pcr: NOT DETECTED
INFLAPCR: NEGATIVE
Influenza B By PCR: NEGATIVE

## 2015-03-17 LAB — APTT: APTT: 38 s — AB (ref 24–37)

## 2015-03-17 LAB — I-STAT TROPONIN, ED: TROPONIN I, POC: 0.01 ng/mL (ref 0.00–0.08)

## 2015-03-17 LAB — LACTIC ACID, PLASMA
LACTIC ACID, VENOUS: 1.9 mmol/L (ref 0.5–2.0)
Lactic Acid, Venous: 3.1 mmol/L (ref 0.5–2.0)

## 2015-03-17 LAB — PROCALCITONIN: PROCALCITONIN: 0.69 ng/mL

## 2015-03-17 LAB — STREP PNEUMONIAE URINARY ANTIGEN: Strep Pneumo Urinary Antigen: NEGATIVE

## 2015-03-17 LAB — MRSA PCR SCREENING: MRSA BY PCR: NEGATIVE

## 2015-03-17 MED ORDER — SODIUM CHLORIDE 0.9 % IV BOLUS (SEPSIS)
1000.0000 mL | Freq: Once | INTRAVENOUS | Status: AC
  Administered 2015-03-17: 1000 mL via INTRAVENOUS

## 2015-03-17 MED ORDER — OSELTAMIVIR PHOSPHATE 75 MG PO CAPS
75.0000 mg | ORAL_CAPSULE | Freq: Two times a day (BID) | ORAL | Status: DC
  Administered 2015-03-17 – 2015-03-18 (×3): 75 mg via ORAL
  Filled 2015-03-17 (×6): qty 1

## 2015-03-17 MED ORDER — DEXTROSE 5 % IV SOLN
500.0000 mg | INTRAVENOUS | Status: DC
  Administered 2015-03-18 – 2015-03-19 (×2): 500 mg via INTRAVENOUS
  Filled 2015-03-17 (×3): qty 500

## 2015-03-17 MED ORDER — ACETAMINOPHEN 325 MG PO TABS
650.0000 mg | ORAL_TABLET | Freq: Four times a day (QID) | ORAL | Status: DC | PRN
  Administered 2015-03-17 – 2015-03-20 (×5): 650 mg via ORAL
  Filled 2015-03-17 (×5): qty 2

## 2015-03-17 MED ORDER — DEXTROSE 5 % IV SOLN: 1.0000 g | INTRAVENOUS | Status: DC

## 2015-03-17 MED ORDER — IPRATROPIUM-ALBUTEROL 0.5-2.5 (3) MG/3ML IN SOLN
3.0000 mL | Freq: Once | RESPIRATORY_TRACT | Status: DC
  Filled 2015-03-17: qty 3

## 2015-03-17 MED ORDER — DEXTROSE 5 % IV SOLN: 500.0000 mg | INTRAVENOUS | Status: DC

## 2015-03-17 MED ORDER — DULOXETINE HCL 60 MG PO CPEP
60.0000 mg | ORAL_CAPSULE | Freq: Every day | ORAL | Status: DC
  Administered 2015-03-17 – 2015-03-24 (×8): 60 mg via ORAL
  Filled 2015-03-17 (×8): qty 1

## 2015-03-17 MED ORDER — BUPRENORPHINE HCL-NALOXONE HCL 5.7-1.4 MG SL SUBL
0.5000 | SUBLINGUAL_TABLET | Freq: Two times a day (BID) | SUBLINGUAL | Status: DC
  Administered 2015-03-18: 1 via SUBLINGUAL

## 2015-03-17 MED ORDER — DEXTROSE 5 % IV SOLN
1.0000 g | Freq: Once | INTRAVENOUS | Status: AC
  Administered 2015-03-17: 1 g via INTRAVENOUS
  Filled 2015-03-17: qty 10

## 2015-03-17 MED ORDER — SODIUM CHLORIDE 0.9 % IJ SOLN
3.0000 mL | Freq: Two times a day (BID) | INTRAMUSCULAR | Status: DC
  Administered 2015-03-18: 10 mL via INTRAVENOUS
  Administered 2015-03-19 – 2015-03-24 (×11): 3 mL via INTRAVENOUS

## 2015-03-17 MED ORDER — ONDANSETRON HCL 4 MG PO TABS: 4.0000 mg | ORAL_TABLET | Freq: Four times a day (QID) | ORAL | Status: DC | PRN

## 2015-03-17 MED ORDER — DEXTROSE 5 % IV SOLN
1.0000 g | INTRAVENOUS | Status: DC
  Administered 2015-03-18 – 2015-03-22 (×5): 1 g via INTRAVENOUS
  Filled 2015-03-17 (×6): qty 10

## 2015-03-17 MED ORDER — ACETAMINOPHEN 650 MG RE SUPP: 650.0000 mg | Freq: Four times a day (QID) | RECTAL | Status: DC | PRN

## 2015-03-17 MED ORDER — IBUPROFEN 400 MG PO TABS
600.0000 mg | ORAL_TABLET | Freq: Once | ORAL | Status: AC
  Administered 2015-03-17: 600 mg via ORAL
  Filled 2015-03-17: qty 1

## 2015-03-17 MED ORDER — ALUM & MAG HYDROXIDE-SIMETH 200-200-20 MG/5ML PO SUSP
30.0000 mL | Freq: Four times a day (QID) | ORAL | Status: DC | PRN
  Filled 2015-03-17: qty 30

## 2015-03-17 MED ORDER — CETYLPYRIDINIUM CHLORIDE 0.05 % MT LIQD
7.0000 mL | Freq: Two times a day (BID) | OROMUCOSAL | Status: DC
  Administered 2015-03-17 – 2015-03-18 (×2): 7 mL via OROMUCOSAL

## 2015-03-17 MED ORDER — SODIUM CHLORIDE 0.9 % IV BOLUS (SEPSIS)
500.0000 mL | Freq: Once | INTRAVENOUS | Status: AC
  Administered 2015-03-17: 500 mL via INTRAVENOUS

## 2015-03-17 MED ORDER — ALBUTEROL SULFATE (2.5 MG/3ML) 0.083% IN NEBU
2.5000 mg | INHALATION_SOLUTION | RESPIRATORY_TRACT | Status: DC | PRN
  Administered 2015-03-20: 2.5 mg via RESPIRATORY_TRACT
  Filled 2015-03-17: qty 3

## 2015-03-17 MED ORDER — DEXTROSE 5 % IV SOLN
500.0000 mg | Freq: Once | INTRAVENOUS | Status: AC
  Administered 2015-03-17: 500 mg via INTRAVENOUS
  Filled 2015-03-17: qty 500

## 2015-03-17 MED ORDER — IPRATROPIUM-ALBUTEROL 0.5-2.5 (3) MG/3ML IN SOLN
3.0000 mL | Freq: Once | RESPIRATORY_TRACT | Status: AC
  Administered 2015-03-17: 3 mL via RESPIRATORY_TRACT
  Filled 2015-03-17: qty 3

## 2015-03-17 MED ORDER — GUAIFENESIN ER 600 MG PO TB12
1200.0000 mg | ORAL_TABLET | Freq: Two times a day (BID) | ORAL | Status: DC
  Administered 2015-03-17 – 2015-03-24 (×14): 1200 mg via ORAL
  Filled 2015-03-17 (×14): qty 2

## 2015-03-17 MED ORDER — DEXTROMETHORPHAN POLISTIREX ER 30 MG/5ML PO SUER
30.0000 mg | Freq: Two times a day (BID) | ORAL | Status: DC | PRN
  Filled 2015-03-17: qty 5

## 2015-03-17 MED ORDER — ENOXAPARIN SODIUM 40 MG/0.4ML ~~LOC~~ SOLN
40.0000 mg | SUBCUTANEOUS | Status: DC
  Administered 2015-03-17 – 2015-03-23 (×7): 40 mg via SUBCUTANEOUS
  Filled 2015-03-17 (×7): qty 0.4

## 2015-03-17 MED ORDER — ONDANSETRON HCL 4 MG/2ML IJ SOLN
4.0000 mg | Freq: Four times a day (QID) | INTRAMUSCULAR | Status: DC | PRN
  Administered 2015-03-18: 4 mg via INTRAVENOUS
  Filled 2015-03-17 (×2): qty 2

## 2015-03-17 MED ORDER — SODIUM CHLORIDE 0.9 % IV SOLN
INTRAVENOUS | Status: DC
  Administered 2015-03-17 – 2015-03-18 (×3): via INTRAVENOUS
  Administered 2015-03-19: 100 mL/h via INTRAVENOUS

## 2015-03-17 MED ORDER — ALPRAZOLAM 0.5 MG PO TABS
1.0000 mg | ORAL_TABLET | Freq: Every day | ORAL | Status: DC
  Administered 2015-03-17 – 2015-03-22 (×6): 1 mg via ORAL
  Filled 2015-03-17 (×6): qty 2

## 2015-03-17 NOTE — ED Notes (Signed)
Note sent to pharmacy for dispensing Tamiflu

## 2015-03-17 NOTE — Progress Notes (Signed)
ANTIBIOTIC CONSULT NOTE - INITIAL  Pharmacy Consult for Ceftriaxone and Azithromycin Indication: pneumonia  No Known Allergies  Patient Measurements: Height: 5\' 9"  (175.3 cm) Weight: 160 lb (72.576 kg) IBW/kg (Calculated) : 70.7  Vital Signs: Temp: 101.6 F (38.7 C) (12/04 1046) Temp Source: Oral (12/04 1046) BP: 109/76 mmHg (12/04 1214) Pulse Rate: 111 (12/04 1214)  Labs:  Recent Labs  03/17/15 0949 03/17/15 1134  WBC 11.2* 9.8  HGB 14.6 14.0  PLT  --  178   Medical History: Past Medical History  Diagnosis Date  . Anxiety     sees Dr. Milagros Evenerupinder Kaur   . Chronic knee pain     sees [facility] ([facility])  . Neck pain   . Numbness on right side   . History of narcotic addiction (HCC)    Medications:  Anti-infectives    Start     Dose/Rate Route Frequency Ordered Stop   03/17/15 1115  cefTRIAXone (ROCEPHIN) 1 g in dextrose 5 % 50 mL IVPB     1 g 100 mL/hr over 30 Minutes Intravenous  Once 03/17/15 1111 03/17/15 1208   03/17/15 1115  azithromycin (ZITHROMAX) 500 mg in dextrose 5 % 250 mL IVPB     500 mg 250 mL/hr over 60 Minutes Intravenous  Once 03/17/15 1111       Assessment: 34yo from urgent care with c/o fever, shortness of breath and was told a diagnosis of pneumonia.  He is to be started on broad spectrum IV antibiotics.  He has no known drug allergies noted.  His wBC earlier was 11.2K but down to 9.8 with las lab draw.  C-XRay does reveal likely pneumonia.  Goal of Therapy:  Therapeutic response to IV Antibiotics  Plan:  - Rocephin 1gm IV daily - Azithromycin 500mg  IV daily - De-escalate as appropriate  Nadara MustardNita Niya Behler, PharmD., MS Clinical Pharmacist Pager:  [phone] Thank you for allowing pharmacy to be part of this patients care team. 03/17/2015,12:26 PM

## 2015-03-17 NOTE — ED Notes (Signed)
Wife stated, [facility] just came [facility] and they did blood work and Personal assistantxray and he has pneumonia. Needs to ne reevaluated.  Sick since Wednesday with fever , chills, vomiting.

## 2015-03-17 NOTE — H&P (Signed)
[facility] History and Physical                                                                                    Luke [name], is a 34 y.o. male  MRN: [id number]   DOB - [birth year]  Admit Date - 03/17/2015  Outpatient Primary MD for Luke patient is Luke Salisbury, MD  Referring Physician:  Gaylyn Rong, PA-C  Chief Complaint:   Chief Complaint  Patient presents with  . Pneumonia  . Chills  . Cough  . Emesis     HPI  Luke [name]  is a 34 y.o. male, with anxiety disorder, chronic pain, and a history of narcotic addiction presents to [facility] emergency department from [facility] urgent care with sepsis and bilateral pneumonia. Luke patient's wife gives most of Luke history as Luke patient is so fatigued. He started feeling poorly on Tuesday with myalgias, by Wednesday he had developed a fever, and on Thursday he began vomiting. His fever persisted and today he was disoriented. She was successful in encouraging him to be seen at [facility] urgent care who obtained an x-ray that demonstrated bilateral pneumonia. He was sent to Luke emergency department for admission.  Luke patient has not had a flu shot this year.  In Luke emergency department vital signs show a temperature of 101.6 respirations 28 and O2 sats of 77%. He was placed on 3.5 L of oxygen in order to maintain normal oxygen saturations. His lactic acid is 3.01 and his white count is 11.2.  Review of Systems  Constitutional: Positive for fever, chills, malaise/fatigue and diaphoresis.  HENT: Positive for congestion.   Eyes: Negative.   Respiratory: Positive for cough, sputum production and shortness of breath.   Cardiovascular: Negative.   Gastrointestinal: Positive for nausea and vomiting. Negative for abdominal pain and diarrhea.  Genitourinary: Negative.   Musculoskeletal: Positive for myalgias.  Skin: Negative.   Neurological: Positive for weakness.  Endo/Heme/Allergies: Negative.   Psychiatric/Behavioral: Negative.      Past  Medical History  Past Medical History  Diagnosis Date  . Anxiety     sees Dr. Milagros [name]   . Chronic knee pain     sees [name] (methadone clinic)  . Neck pain   . Numbness on right side   . History of narcotic addiction [facility])     Past Surgical History  Procedure Laterality Date  . Knee surgery  1999 and 2000    per Dr. Richardson [name]       Social History Social History  Substance Use Topics  . Smoking status: Never Smoker   . Smokeless tobacco: Never Used  . Alcohol Use: 0.0 oz/week    0 Standard drinks or equivalent per week     Comment: rare   lives at home. Independent with ADLs. Is a Consulting civil engineer.  Family History Family History  Problem Relation Age of Onset  . Depression Father   . Bipolar disorder Mother    no known lung disease in Luke family  Prior to Admission medications   Medication Sig Start Date End Date Taking? Authorizing Provider  ALPRAZolam Prudy Feeler) 1 MG tablet Take 1 mg by mouth daily.  Yes Historical Provider, MD  Buprenorphine HCl-Naloxone HCl (ZUBSOLV) 5.7-1.4 MG SUBL Place 0.5-1 each under Luke tongue 2 (two) times daily.    Yes Historical Provider, MD  DULoxetine (CYMBALTA) 60 MG capsule Take 60 mg by mouth daily.   Yes Historical Provider, MD  ibuprofen (ADVIL,MOTRIN) 200 MG tablet Take 400 mg by mouth every 6 (six) hours as needed for fever (pain).   Yes Historical Provider, MD    No Known Allergies  Physical Exam  Vitals  Blood pressure 103/63, pulse 96, temperature 101.6 F (38.7 C), temperature source Oral, resp. rate 22, height 5\' 9"  (1.753 m), weight 72.576 kg (160 lb), SpO2 93 %.   General: Thin but well-developed male lying in bed.  Appears fatigued and ill, wife at bedside  Psych:  Normal affect and insight, Not Suicidal or Homicidal, Awake Alert, Oriented X 3.  Neuro:   No F.N deficits, ALL C.Nerves Intact, Strength 5/5 all 4 extremities, Sensation intact all 4 extremities.  ENT:  Ears and Eyes appear Normal,  Conjunctivae clear, PER.   Neck:  Supple, No lymphadenopathy appreciated  Respiratory:  Crackles cleared with cough. Symmetrical chest wall movement, Good air movement bilaterally, CTAB.  Cardiac:  RRR, No Murmurs, no LE edema noted, no JVD.    Abdomen:  Positive bowel sounds, Soft, Non tender, Non distended,  No masses appreciated  Skin:  No Cyanosis, Normal Skin Turgor, No Skin Rash or Bruise.  Extremities:  Able to move all 4. 5/5 strength in each,  no effusions.  Data Review  Wt Readings from Last 3 Encounters:  03/17/15 72.576 kg (160 lb)  03/17/15 73.573 kg (162 lb 3.2 oz)  07/11/14 68.493 kg (151 lb)    CBC  Recent Labs Lab 03/17/15 0949 03/17/15 1134  WBC 11.2* 9.8  HGB 14.6 14.0  HCT 41.3* 41.1  PLT  --  178  MCV 97.3* 98.1  MCH 34.5* 33.4  MCHC 35.5* 34.1  RDW  --  12.9  LYMPHSABS  --  0.6*  MONOABS  --  0.4  EOSABS  --  0.0  BASOSABS  --  0.0    Chemistries   Recent Labs Lab 03/17/15 1134  NA 135  K 3.7  CL 97*  CO2 30  GLUCOSE 136*  BUN 18  CREATININE 1.10  CALCIUM 8.7*    No results found for: HGBA1C  CREATININE: 1.1 (03/17/15 1134) Estimated creatinine clearance - 93.7 mL/min     Urinalysis: Pending  Imaging results:   Dg Chest 2 View  03/17/2015  CLINICAL DATA:  Cough.  Fever. EXAM: CHEST  2 VIEW COMPARISON:  None. FINDINGS: Normal heart size. Normal mediastinal contour. No pneumothorax. No pleural effusion. There is severe patchy consolidation in Luke lingula and both lower lobes. There is mild patchy consolidation in Luke parahilar left upper lobe. IMPRESSION: Severe patchy consolidation in both lower lobes, lingula and left upper lobe, most suggestive of a multilobar pneumonia. Recommend follow-up PA and lateral post treatment chest radiographs in 6-8 weeks. Electronically Signed   By: Delbert PhenixJason A Poff M.D.   On: 03/17/2015 10:03    My personal review of EKG: Not performed in [facility] ED.  Assessment & Plan  Principal Problem:    Sepsis (HCC) Active Problems:   Chronic knee pain   Anxiety disorder   Acute respiratory failure with hypoxia (HCC)   Bilateral pneumonia   Sepsis Lactic acid 3, temperature 101.6, white count 11.2, respirations 28, O2 saturation 77% Blood cultures were obtained unfortunately after antibiotics  were given. Sputum cultures and urine culture have been ordered Patient placed on azithromycin and Rocephin, and admitted to stepdown unit.   Acute hypoxic respiratory failure Secondary to bilateral pneumonia, possible influenza,? HIV? Placed on when necessary oxygen. When necessary nebulizers   Bilateral pneumonia Likely secondary to influenza.  Influenza panel, HIV, urine for Legionella and strep pneumo are pending Patient placed on antibiotics, mucolytic's, cough suppressants, when necessary nebulizers, when necessary oxygen Tamiflu and droplet precautions started empirically. Please discontinue if influenza panel is negative. Follow-up chest x-ray recommended to verify clearing.   Chronic knee pain with history of narcotic addiction. Have requested pharmacy to appropriately substitute for Buprenorphine-Naloxone.  Avoid additional narcotic medications.   Anxiety disorder  Continue Xanax twice a day when necessary    Consultants Called:  None   Family Communication:     Wife at bedside  Code Status:    Full code  Condition:    Full code  Potential Disposition:   To home when improved . (An estimated 3 days)  Time spent in minutes : 52   [facility] Algis Downs,  [place] on 03/17/2015 at 2:15 PM Between 7am to 7pm - Pager - [phone] After 7pm go to [email or web address] - password [email or web address] And look for Luke night coverage person covering me after hours

## 2015-03-17 NOTE — Progress Notes (Addendum)
 @UMFCLOGO @  By signing my name below, I, Raven Small, attest that this documentation has been prepared under the direction and in the presence of Elvina SidleKurt Lauenstein, MD.  Electronically Signed: Andrew Auaven Small, ED Scribe. 03/17/2015. 9:21 AM.  Patient ID: Luke [name] MRN: [id number], DOB: 10-Mar-1981, 34 y.o. Date of Encounter: 03/17/2015, 9:20 AM  Primary Physician: Nelwyn SalisburyFRY,STEPHEN A, MD  Chief Complaint: Chief Complaint  Patient presents with   Influenza   Shortness of Breath   Fever    wednesday - friday   Cough   Sore Throat     HPI: 34 y.o. year old male with history below presents with fever and body ache. Pt states symptoms started 4 days ago with a fever, chills, HA and body aches. Over the past few days symptoms have gradually worsened with cough, SOB feeling as if he cannot get a deep breath and chest congestion.   His male partner is also concerned of decreased appetite, states he has not had much to eat for the past couple of days. He has taken tylenol and ibuprofen with temporary relief to fever but symptoms still persist. Pt is not a smoker.   He has not rec'd flu shot this year, saying he didn't need it.  Past Medical History  Diagnosis Date   Anxiety     sees Dr. Milagros Evenerupinder Kaur    Chronic knee pain     [facility]   Neck pain    Numbness on right side    History of narcotic addiction (HCC)      Home Meds: Prior to Admission medications   Medication Sig Start Date End Date Taking? Authorizing Provider  ALPRAZolam Prudy Feeler(XANAX) 1 MG tablet Take 1 mg by mouth daily.    Yes Historical Provider, MD  Buprenorphine HCl-Naloxone HCl (ZUBSOLV) 5.7-1.4 MG SUBL Place under the tongue 2 (two) times daily.   Yes Historical Provider, MD  DULoxetine (CYMBALTA) 60 MG capsule Take 60 mg by mouth daily.   Yes Historical Provider, MD  chlordiazePOXIDE (LIBRIUM) 25 MG capsule Take 25 mg by mouth 3 (three) times daily as needed for anxiety.    Historical  Provider, MD    Allergies: No Known Allergies  Social History   Social History   Marital Status: Single    Spouse Name: N/A   Number of Children: 1   Years of Education: Some colle   Occupational History   Social research officer, governmenttore Manager    Social History Main Topics   Smoking status: Never Smoker    Smokeless tobacco: Never Used   Alcohol Use: 0.0 oz/week    0 Standard drinks or equivalent per week     Comment: rare   Drug Use: No   Sexual Activity: Not on file   Other Topics Concern   Not on file   Social History Narrative   May have 1 energy drink during day      Review of Systems: Constitutional: negative for  night sweats, weight changes. Positive for fatigue chills, fever, HEENT: negative for vision changes, hearing loss, congestion, rhinorrhea, ST, epistaxis, or sinus pressure Cardiovascular: negative for chest pain or palpitations Respiratory: negative for hemoptysis, wheezing, Positive for shortness of breath and cough Abdominal: negative for abdominal pain, nausea, vomiting, diarrhea, or constipation Dermatological: negative for rash Neurologic: negative for headache, dizziness, or syncope All other systems reviewed and are otherwise negative with the exception to those above and in the HPI.   Physical Exam: Blood pressure 130/60, pulse 114, temperature 99.1  F (37.3 C), temperature source Oral, resp. rate 18, height  (1.778 m), weight 162 lb 3.2 oz (73.573 kg), SpO2 77 %., Body mass index is 23.27 kg/(m^2). General: Well developed, well nourished, in no acute distress. Head: Normocephalic, atraumatic, eyes without discharge, sclera non-icteric, nares are without discharge. Bilateral auditory canals with cerumen(lavaged clear), TM's are without perforation, pearly grey and translucent with reflective cone of light bilaterally. Oral cavity moist, posterior pharynx without exudate, erythema, peritonsillar abscess, or post nasal drip. Tongue is coated with red  cough syrup.  Neck: Supple. No thyromegaly. Full ROM. No lymphadenopathy. Lungs: Rales in left base. Breathing is unlabored. Heart: RRR with S1 S2. No murmurs, rubs, or gallops appreciated. Msk:  Strength and tone normal for age. Extremities/Skin: Warm and dry. No clubbing or cyanosis. No edema. No rashes or suspicious lesions. Neuro: Alert and oriented X 3. Moves all extremities spontaneously. Gait is normal. CNII-XII grossly in tact. Psych:  Responds to questions appropriately with a normal affect.   [facility] reading (PRIMARY) by  Dr. Milus Glazier. CXR- infiltrates bilaterally  Results for orders placed or performed in visit on 03/17/15  POCT CBC  Result Value Ref Range   WBC 11.2 (A) 4.6 - 10.2 K/uL   Lymph, poc 0.6 0.6 - 3.4   POC LYMPH PERCENT 5.2 (A) 10 - 50 %L   MID (cbc) 0.4 0 - 0.9   POC MID % 3.7 0 - 12 %M   POC Granulocyte 10.2 (A) 2 - 6.9   Granulocyte percent 91.1 (A) 37 - 80 %G   RBC 4.24 (A) 4.69 - 6.13 M/uL   Hemoglobin 14.6 14.1 - 18.1 g/dL   HCT, POC 40.9 (A) 81.1 - 53.7 %   MCV 97.3 (A) 80 - 97 fL   MCH, POC 34.5 (A) 27 - 31.2 pg   MCHC 35.5 (A) 31.8 - 35.4 g/dL   RDW, POC 91.4 %   Platelet Count, POC 173 142 - 424 K/uL   MPV 7.6 0 - 99.8 fL     ASSESSMENT AND PLAN:  34 y.o. year old male with severe pneumonia and hypoxia.    ICD-9-CM ICD-10-CM   1. Cough 786.2 R05 POCT CBC     DG Chest 2 View  2. Fever and chills 780.60 R50.9 POCT CBC     DG Chest 2 View  3. Headache, unspecified headache type 784.0 R51 POCT CBC     DG Chest 2 View   patient is directed to go directly to the emergency room at [place]. Family and patient agreed to go immediately Signed, Elvina Sidle, MD 03/17/2015 9:20 AM

## 2015-03-17 NOTE — ED Provider Notes (Signed)
Mr. Luke [name] is a previously health 34 y.o male presents today with several day history of ili now with productive cough and dyspnea.  CXR reveals multilobar pneumonia.  Patient  Initially say 82% on arrival. Patient initially presented to [facility] and sent here by pv.  Patient treated here per sepsis protocol. Sepsis - Repeat Assessment  Performed at:    2:47 PM   Vitals     Blood pressure 95/57, pulse 85, temperature 101.6 F (38.7 C), temperature source Oral, resp. rate 26, height 5\' 9"  (1.753 m), weight 72.576 kg, SpO2 93 %.  Heart:     Tachycardic  Lungs:    Rhonchi  Capillary Refill:   <2 sec  Peripheral Pulse:   Radial pulse palpable  Skin:     Normal Color     I performed a history and physical examination of Luke [name] and discussed his management with Ms. Dowless.  I agree with the history, physical, assessment, and plan of care, with the following exceptions: None  I was present for the following procedures: None Time Spent in Critical Care of the patient: None Time spent in discussions with the patient and family: [id number]  Margarine Grosshans Hilario QuarryS      Endiya Klahr, MD 03/17/15 [phone]

## 2015-03-17 NOTE — ED Notes (Signed)
Pt instructed to take deep breaths

## 2015-03-17 NOTE — Consult Note (Addendum)
Name: Luke [name] MRN: [id number] DOB: 04/09/1981    ADMISSION DATE:  03/17/2015 CONSULTATION DATE:  03/17/2015  REFERRING MD:  Marlin Canary, D.O.  CHIEF COMPLAINT:  Multifocal Pneumonia with Acute Hypoxic Respiratory Failure  SIGNIFICANT EVENTS  12/4 - Admit to Hospital  STUDIES:  CXR PA/LAT 12/4:  Personally reviewed by me. Patchy opacification RLL, LLL, Lingula, & LUL. No pleural effusion. Heart normal in size. Mediastinum normal in contour.  MICROBIOLOGY: Sputum 12/4>>> Blood x2 12/4>>> Influenza PCR 12/4>>> HIV 12/4>>> U Legionella Ag 12/4>>> U Strep Ag 12/4>>>  ANTIBIOTICS: Tamiflu 12/4>>> Rocephin 12/4>>> Azithromycin 12/4>>>  HISTORY OF PRESENT ILLNESS:  Patient reports that starting on 11/30 he "felt like he had the flu". He experienced nausea and headache. He left his work early with extreme fatigue. He has had excessive somnolence. Patient reports subjective fever with chills and sweats as well as myalgias. He did have nausea and vomiting on Thursday but denies any diarrhea. He has experienced increasing dyspnea. He does report that he has trouble with deep breathing due to cough that is now productive of a greenish phlegm. Denies any hemoptysis. Denies any sore throat. Has had sinus congestion. Sick exposures through his children with upper respiratory symptoms.  PAST MEDICAL HISTORY :  Past Medical History  Diagnosis Date  . Anxiety     sees Dr. Milagros Evener   . Chronic knee pain     sees [employer] (methadone clinic)  . Neck pain   . Numbness on right side   . History of narcotic addiction (HCC)     PAST SURGICAL HISTORY: Past Surgical History  Procedure Laterality Date  . Knee surgery  1999 and 2000    per Dr. Richardson Landry   . Inner ear surgery      Prior to Admission medications   Medication Sig Start Date End Date Taking? Authorizing Provider  ALPRAZolam Prudy Feeler) 1 MG tablet Take 1 mg by mouth daily.    Yes Historical Provider, MD    Buprenorphine HCl-Naloxone HCl (ZUBSOLV) 5.7-1.4 MG SUBL Place 0.5-1 each under the tongue 2 (two) times daily.    Yes Historical Provider, MD  DULoxetine (CYMBALTA) 60 MG capsule Take 60 mg by mouth daily.   Yes Historical Provider, MD  ibuprofen (ADVIL,MOTRIN) 200 MG tablet Take 400 mg by mouth every 6 (six) hours as needed for fever (pain).   Yes Historical Provider, MD   No Known Allergies  FAMILY HISTORY:  Family History  Problem Relation Age of Onset  . Depression Father   . Bipolar disorder Mother   . Throat cancer Maternal Grandmother   . Heart attack Paternal Grandfather 81    SOCIAL HISTORY: Social History   Social History  . Marital Status: Single    Spouse Name: N/A  . Number of Children: 4  . Years of Education: Some colle   Occupational History  . 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 Smoking status: Never Smoker   . Smokeless tobacco: Never Used  . Alcohol Use: 0.0 oz/week    0 Standard drinks or equivalent per week     Comment: Social  . Drug Use: No  . Sexual Activity: Not Asked   Other Topics Concern  . None   Social History Narrative   Patient is married with 4 children. Originally from [place]. No mold or pet exposure. No tuberculosis exposure. No recent travel. Traveled to [place] 2 years ago.    REVIEW OF SYSTEMS:  No rashes or abnormal bruising. No dysuria or hematuria. A pertinent 14 point review of systems is negative except as per the HPI.  SUBJECTIVE:   VITAL SIGNS: Temp:  [98.4 F (36.9 C)-101.6 F (38.7 C)] 98.4 F (36.9 C) (12/04 1511) Pulse Rate:  [85-116] 90 (12/04 1511) Resp:  [18-28] 22 (12/04 1511) BP: (95-130)/(57-81) 95/68 mmHg (12/04 1511) SpO2:  [77 %-94 %] 91 % (12/04 1511) Weight:  [160 lb (72.576 kg)-162 lb 11.2 oz (73.8 kg)] 162 lb 11.2 oz (73.8 kg) (12/04 1511)  PHYSICAL EXAMINATION: General:  Awake. Alert. No acute distress. Wife at bedside. Watching TV in pajamas. Integument:  Warm & dry. No rash  on exposed skin. No bruising. Lymphatics:  No appreciated cervical or supraclavicular lymphadenoapthy. HEENT:  Tacky mucus membranes. No oral ulcers. No scleral injection or icterus.  Cardiovascular:  Regular rate. No edema. No appreciable JVD.  Pulmonary:  Good aeration & clear to auscultation bilaterally. Symmetric chest wall expansion. No accessory muscle use on nasal cannula oxygen. Abdomen: Soft. Normal bowel sounds. Nondistended. Grossly nontender. Musculoskeletal:  Normal bulk and tone. Hand grip strength 5/5 bilaterally. No joint deformity or effusion appreciated. Neurological:  CN 2-12 grossly in tact. No meningismus. Moving all 4 extremities equally. Symmetric brachioradialis deep tendon reflexes. Psychiatric:  Mood and affect congruent. Speech normal rhythm, rate & tone.    Recent Labs Lab 03/17/15 1134 03/17/15 1354  NA 135 133*  K 3.7 3.3*  CL 97* 99*  CO2 30 26  BUN 18 17  CREATININE 1.10 1.06  GLUCOSE 136* 158*    Recent Labs Lab 03/17/15 0949 03/17/15 1134  HGB 14.6 14.0  HCT 41.3* 41.1  WBC 11.2* 9.8  PLT  --  178   Dg Chest 2 View  03/17/2015  CLINICAL DATA:  Cough.  Fever. EXAM: CHEST  2 VIEW COMPARISON:  None. FINDINGS: Normal heart size. Normal mediastinal contour. No pneumothorax. No pleural effusion. There is severe patchy consolidation in the lingula and both lower lobes. There is mild patchy consolidation in the parahilar left upper lobe. IMPRESSION: Severe patchy consolidation in both lower lobes, lingula and left upper lobe, most suggestive of a multilobar pneumonia. Recommend follow-up PA and lateral post treatment chest radiographs in 6-8 weeks. Electronically Signed   By: Delbert PhenixJason A Poff M.D.   On: 03/17/2015 10:03    ASSESSMENT / PLAN: 34 year old Caucasian male with multifocal pneumonia. Given his constellation of symptoms of viral etiology is certainly quite probable with possible bacterial superinfection given his ongoing symptoms and discolored  sputum. Patient was started on broad-spectrum antibiotics in the emergency department.his lactic acidosis from his underlying sepsis secondary to his pneumonia is improving and has resolved. Patient's oxygen requirement remains relatively stable at this time with his acute hypoxic respiratory failure. At this time I did speak with the patient's nurse at bedside discussing the need for  Further specimens to be collected to identify potential infectious etiologies. Given his continued clinical stability transition to the intensive care unit is not necessary.  1. Sepsis: Secondary to multifocal pneumonia. Lactic acidosis has resolved. Awaiting cultures. Continuing broad-spectrum antibiotics. Procalcitonin algorithm initiated. 2. Acute hypoxic respiratory failure: Sclerae to multifocal pneumonia. Recommend continuing oxygen supplementation to maintain saturation greater than 92%. 3. Multifocal pneumonia: Respiratory and blood cultures pending. Urine streptococcal & legionella antigens pending. Influenza PCR & HIV pending. Patient continuing on day #1 of Rocephin, azithromycin, & Tamiflu. Given the duration of the patient's symptoms Tamiflu may be of little use in treatment as it's  been over 72 hours. Delsym for cough. 4. Lactic acidosis: Resolved. 5. Hypokalemia: Management per primary service.  Donna Christen Jamison Neighbor, M.D. [facility] Pager:  [phone] After 3pm or if no response, call [phone] 03/17/2015, 6:35 PM

## 2015-03-17 NOTE — ED Provider Notes (Signed)
CSN: [id number]     Arrival date & time 03/17/15  1014 History   First MD Initiated Contact with Patient 03/17/15 1101     Chief Complaint  Patient presents with  . Pneumonia  . Chills  . Cough  . Emesis     (Consider location/radiation/quality/duration/timing/severity/associated sxs/prior Treatment) HPI  Jacori Mulrooney is a 34 year old male with no sniffing a past medical history who presents to the emergency department today from an urgent care to be evaluated for pneumonia. Patient states that over the last 4 days he has been experiencing progressively worsening shortness of breath and intermittent fevers. Patient also has associated cough and congestion. Patient was seen in urgent care today where they performed a chest x-ray and a CBC. Chest x-ray revealed extensive multilobar pneumonia and was sent immediately to the emergency department. Denies chest pain, dizziness, syncope, vomiting, diarrhea, abdominal pain, headache or blurry vision. Past Medical History  Diagnosis Date  . Anxiety     sees Dr. Milagros Evener   . Chronic knee pain     sees [employer] (methadone clinic)  . Neck pain   . Numbness on right side   . History of narcotic addiction [facility])    Past Surgical History  Procedure Laterality Date  . Knee surgery  1999 and 2000    per Dr. Richardson Landry    Family History  Problem Relation Age of Onset  . Depression Father   . Bipolar disorder Mother    Social History  Substance Use Topics  . Smoking status: Never Smoker   . Smokeless tobacco: Never Used  . Alcohol Use: 0.0 oz/week    0 Standard drinks or equivalent per week     Comment: rare    Review of Systems  All other systems reviewed and are negative.     Allergies  Review of patient's allergies indicates no known allergies.  Home Medications   Prior to Admission medications   Medication Sig Start Date End Date Taking? Authorizing Provider  ALPRAZolam Prudy Feeler) 1 MG tablet Take 1 mg by mouth  daily.    Yes Historical Provider, MD  Buprenorphine HCl-Naloxone HCl (ZUBSOLV) 5.7-1.4 MG SUBL Place 0.5-1 each under the tongue 2 (two) times daily.    Yes Historical Provider, MD  DULoxetine (CYMBALTA) 60 MG capsule Take 60 mg by mouth daily.   Yes Historical Provider, MD  ibuprofen (ADVIL,MOTRIN) 200 MG tablet Take 400 mg by mouth every 6 (six) hours as needed for fever (pain).   Yes Historical Provider, MD   BP 109/76 mmHg  Pulse 111  Temp(Src) 101.6 F (38.7 C) (Oral)  Resp 22  Ht  (1.753 m)  Wt 72.576 kg  BMI 23.62 kg/m2  SpO2 94% Physical Exam  Constitutional: He is oriented to person, place, and time. He appears well-developed and well-nourished. No distress.  Ill-appearing M, lying in bed  HENT:  Head: Normocephalic and atraumatic.  Right Ear: External ear normal.  Mouth/Throat: Oropharynx is clear and moist. No oropharyngeal exudate.  Eyes: Conjunctivae and EOM are normal. Pupils are equal, round, and reactive to light. Right eye exhibits no discharge. Left eye exhibits no discharge. No scleral icterus.  Neck: Neck supple. No JVD present. No tracheal deviation present.  Cardiovascular: Normal rate, regular rhythm, normal heart sounds and intact distal pulses.  Exam reveals no gallop and no friction rub.   No murmur heard. Pulmonary/Chest: No stridor. No respiratory distress. He has no wheezes. He has no rales. He exhibits no tenderness.  Diminished breath sounds in bilateral lung bases.   Abdominal: Soft. He exhibits no distension. There is no tenderness. There is no guarding.  Musculoskeletal: Normal range of motion. He exhibits no edema.  Lymphadenopathy:    He has no cervical adenopathy.  Neurological: He is alert and oriented to person, place, and time. No cranial nerve deficit.  Strength 5/5 throughout. No sensory deficits.    Skin: Skin is warm and dry. No rash noted. He is not diaphoretic. No erythema. No pallor.  Psychiatric: He has a normal mood and affect.  His behavior is normal.  Nursing note and vitals reviewed.   ED Course  Procedures (including critical care time) Labs Review Labs Reviewed  CBC WITH DIFFERENTIAL/PLATELET - Abnormal; Notable for the following:    RBC 4.19 (*)    Neutro Abs 8.8 (*)    Lymphs Abs 0.6 (*)    All other components within normal limits  I-STAT ARTERIAL BLOOD GAS, ED - Abnormal; Notable for the following:    pH, Arterial 7.462 (*)    pO2, Arterial 61.0 (*)    Bicarbonate 26.2 (*)    Acid-Base Excess 3.0 (*)    All other components within normal limits  BASIC METABOLIC PANEL  I-STAT CG4 LACTIC ACID, ED  Rosezena SensorI-STAT TROPOININ, ED    Imaging Review Dg Chest 2 View  03/17/2015  CLINICAL DATA:  Cough.  Fever. EXAM: CHEST  2 VIEW COMPARISON:  None. FINDINGS: Normal heart size. Normal mediastinal contour. No pneumothorax. No pleural effusion. There is severe patchy consolidation in the lingula and both lower lobes. There is mild patchy consolidation in the parahilar left upper lobe. IMPRESSION: Severe patchy consolidation in both lower lobes, lingula and left upper lobe, most suggestive of a multilobar pneumonia. Recommend follow-up PA and lateral post treatment chest radiographs in 6-8 weeks. Electronically Signed   By: Delbert PhenixJason A Poff M.D.   On: 03/17/2015 10:03   I have personally reviewed and evaluated these images and lab results as part of my medical decision-making.   EKG Interpretation None      MDM   Final diagnoses:  Bilateral pulmonary infiltrates on chest x-ray   Otherwise healthy 34 y.o M presents from UC, with multilobar PNA seen on CXR. On initial presentation to ED, O2 sats were 82%. Pt placed on 3.5 L O2 and sats rose to 91-93%. Pt given 1 duo-neb. Pt states mild smptomatic improvement after intervention. Tmax is 101.6. Given ibuprofen. No overt wheezing heard on lung auscultation. Will not give steroids at this time. ABX administered.   Repeat CBC here showed NO leukocytosis. WBC 9 Lactic acid  3.0. ABG showed pH 7.46, pCO2 37, pO2 61. Hypoxia.  Given fever, tachycardia and source of infection, code sepsis was called. Fluid administered and blood cultures drawn. Pt not able to maintain O2 saturation above 93% on 3.5 L O2.   Spoke with hospitalist who will admit pt to step down.   Patient was discussed with and seen by Dr. Rosalia Hammersay who agrees with the treatment plan.      Lester KinsmanSamantha Tripp Holters CrossingDowless, PA-C 03/18/15 1615  Margarita Grizzleanielle Ray, MD 03/19/15 [id number]

## 2015-03-18 ENCOUNTER — Inpatient Hospital Stay (HOSPITAL_COMMUNITY): Payer: BLUE CROSS/BLUE SHIELD

## 2015-03-18 DIAGNOSIS — E871 Hypo-osmolality and hyponatremia: Secondary | ICD-10-CM

## 2015-03-18 DIAGNOSIS — R918 Other nonspecific abnormal finding of lung field: Secondary | ICD-10-CM

## 2015-03-18 DIAGNOSIS — A403 Sepsis due to Streptococcus pneumoniae: Secondary | ICD-10-CM

## 2015-03-18 DIAGNOSIS — E876 Hypokalemia: Secondary | ICD-10-CM

## 2015-03-18 LAB — BASIC METABOLIC PANEL
ANION GAP: 5 (ref 5–15)
BUN: 11 mg/dL (ref 6–20)
CALCIUM: 7.5 mg/dL — AB (ref 8.9–10.3)
CO2: 25 mmol/L (ref 22–32)
Chloride: 105 mmol/L (ref 101–111)
Creatinine, Ser: 0.8 mg/dL (ref 0.61–1.24)
GFR calc non Af Amer: >60 mL/min (ref >60)
GLUCOSE: 135 mg/dL — AB (ref 65–99)
POTASSIUM: 3.7 mmol/L (ref 3.5–5.1)
Sodium: 135 mmol/L (ref 135–145)

## 2015-03-18 LAB — RAPID URINE DRUG SCREEN, HOSP PERFORMED
AMPHETAMINES: NOT DETECTED
BENZODIAZEPINES: POSITIVE — AB
Barbiturates: NOT DETECTED
Cocaine: NOT DETECTED
Opiates: NOT DETECTED
Tetrahydrocannabinol: NOT DETECTED

## 2015-03-18 LAB — CBC
HEMATOCRIT: 34 % — AB (ref 39.0–52.0)
HEMOGLOBIN: 11.9 g/dL — AB (ref 13.0–17.0)
MCH: 34.2 pg — AB (ref 26.0–34.0)
MCHC: 35 g/dL (ref 30.0–36.0)
MCV: 97.7 fL (ref 78.0–100.0)
Platelets: 156 10*3/uL (ref 150–400)
RBC: 3.48 MIL/uL — AB (ref 4.22–5.81)
RDW: 13.5 % (ref 11.5–15.5)
WBC: 7.6 10*3/uL (ref 4.0–10.5)

## 2015-03-18 LAB — HIV ANTIBODY (ROUTINE TESTING W REFLEX): HIV SCREEN 4TH GENERATION: NONREACTIVE

## 2015-03-18 MED ORDER — BUPRENORPHINE HCL-NALOXONE HCL 5.7-1.4 MG SL SUBL
0.5000 | SUBLINGUAL_TABLET | Freq: Two times a day (BID) | SUBLINGUAL | Status: DC
  Administered 2015-03-18 – 2015-03-19 (×2): 0.5 via SUBLINGUAL
  Administered 2015-03-19: 0.5 % via SUBLINGUAL
  Administered 2015-03-20: 1 via SUBLINGUAL
  Administered 2015-03-20 – 2015-03-24 (×8): 0.5 via SUBLINGUAL

## 2015-03-18 MED ORDER — SODIUM CHLORIDE 0.9 % IV BOLUS (SEPSIS)
500.0000 mL | Freq: Once | INTRAVENOUS | Status: AC
  Administered 2015-03-18: 500 mL via INTRAVENOUS

## 2015-03-18 MED ORDER — CETYLPYRIDINIUM CHLORIDE 0.05 % MT LIQD
7.0000 mL | Freq: Two times a day (BID) | OROMUCOSAL | Status: DC
  Administered 2015-03-19 – 2015-03-22 (×8): 7 mL via OROMUCOSAL

## 2015-03-18 MED ORDER — CHLORHEXIDINE GLUCONATE 0.12 % MT SOLN
15.0000 mL | Freq: Two times a day (BID) | OROMUCOSAL | Status: DC
  Administered 2015-03-18 – 2015-03-22 (×7): 15 mL via OROMUCOSAL
  Filled 2015-03-18 (×4): qty 15

## 2015-03-18 MED ORDER — OSELTAMIVIR PHOSPHATE 75 MG PO CAPS
75.0000 mg | ORAL_CAPSULE | Freq: Two times a day (BID) | ORAL | Status: DC
  Administered 2015-03-18: 75 mg via ORAL
  Filled 2015-03-18 (×4): qty 1

## 2015-03-18 NOTE — Progress Notes (Signed)
Name: Luke [name] MRN: [id number] DOB: 02/29/1981    ADMISSION DATE:  03/17/2015 CONSULTATION DATE:  03/17/2015  REFERRING MD:  Marlin CanaryJessica Vann, D.O.  CHIEF COMPLAINT:  Multifocal Pneumonia with Acute Hypoxic Respiratory Failure   SUBJECTIVE: Pt remains febrile to 101.9, on 15L O2,  Lactate cleared.  Wife reports pt intermittently confused.  Pulling mask off face.     VITAL SIGNS: Temp:  [98.4 F (36.9 C)-102.9 F (39.4 C)] 101.9 F (38.8 C) (12/05 0700) Pulse Rate:  [85-116] 96 (12/05 0800) Resp:  [20-36] 27 (12/05 0800) BP: (95-127)/(57-81) 104/67 mmHg (12/05 0800) SpO2:  [75 %-97 %] 97 % (12/05 0800) Weight:  [162 lb 11.2 oz (73.8 kg)] 162 lb 11.2 oz (73.8 kg) (12/04 1511)  PHYSICAL EXAMINATION: General:  Awake. Alert. No acute distress. Wife at bedside. Watching TV in pajamas. Integument:  Warm & dry. No rash on exposed skin. No bruising. Lymphatics:  No appreciated cervical or supraclavicular lymphadenoapthy. HEENT:  Tacky mucus membranes. No oral ulcers. No scleral injection or icterus.  Cardiovascular:  Regular rate. No edema. No appreciable JVD.  Pulmonary:  Good aeration & clear to auscultation bilaterally. Symmetric chest wall expansion. No accessory muscle use on nasal cannula oxygen. Abdomen: Soft. Normal bowel sounds. Nondistended. Grossly nontender. Musculoskeletal:  Normal bulk and tone. Hand grip strength 5/5 bilaterally. No joint deformity or effusion appreciated. Neurological:  CN 2-12 grossly in tact. No meningismus. Moving all 4 extremities equally. Symmetric brachioradialis deep tendon reflexes. Psychiatric:  Mood and affect congruent. Speech normal rhythm, rate & tone.    Recent Labs Lab 03/17/15 1134 03/17/15 1354 03/18/15 0458  NA 135 133* 135  K 3.7 3.3* 3.7  CL 97* 99* 105  CO2 30 26 25   BUN 18 17 11   CREATININE 1.10 1.06 0.80  GLUCOSE 136* 158* 135*    Recent Labs Lab 03/17/15 0949 03/17/15 1134 03/18/15 0458  HGB 14.6 14.0 11.9*    HCT 41.3* 41.1 34.0*  WBC 11.2* 9.8 7.6  PLT  --  178 156   Dg Chest 2 View  03/17/2015  CLINICAL DATA:  Cough.  Fever. EXAM: CHEST  2 VIEW COMPARISON:  None. FINDINGS: Normal heart size. Normal mediastinal contour. No pneumothorax. No pleural effusion. There is severe patchy consolidation in the lingula and both lower lobes. There is mild patchy consolidation in the parahilar left upper lobe. IMPRESSION: Severe patchy consolidation in both lower lobes, lingula and left upper lobe, most suggestive of a multilobar pneumonia. Recommend follow-up PA and lateral post treatment chest radiographs in 6-8 weeks. Electronically Signed   By: Delbert PhenixJason A Poff M.D.   On: 03/17/2015 10:03    SIGNIFICANT EVENTS  12/4 - Admit to Hospital 12/5  Transfer to ICU for increase O2 needs / accessory muscle use  STUDIES:  CXR PA/LAT 12/4 >> Patchy opacification RLL, LLL, Lingula, & LUL. No pleural effusion. Heart normal in size. Mediastinum normal in contour.  MICROBIOLOGY: Sputum 12/4 >> Blood x2 12/4 >> Influenza PCR 12/4 >> negative  HIV 12/4 >> U Legionella Ag 12/4 >> U Strep Ag 12/4 >> neg  ANTIBIOTICS: Tamiflu 12/4 >> Rocephin 12/4 >> Azithromycin 12/4 >>  ASSESSMENT / PLAN:   Discussion:  34 year old Caucasian male with multifocal pneumonia. Given his constellation of symptoms of viral etiology is certainly quite probable with possible bacterial superinfection given his ongoing symptoms and discolored sputum. Patient was started on broad-spectrum antibiotics in the emergency department.  Lactic acidosis from his underlying sepsis secondary to his pneumonia is  improving and has resolved. Patient's oxygen requirement remains relatively stable at this time with his acute hypoxic respiratory failure.     ASSESSMENT / PLAN:  PULMONARY A: Diffuse Bilateral Infiltrates / Multifocal PNA Bilateral Atelectasis  Acute Hypoxic Respiratory Failure - concern for developing ARDS Denies smoking history. P:    Transfer to ICU to monitor respiratory status more closely High risk for intubation  See ID O2 as needed to support sats > 92% Pulmonary hygiene as able Intermittent CXR Mucinex / delsym  BiPAP PRN for increased WOB   CARDIOVASCULAR A:  Tachycardia - suspect reactive  P:  ICU monitoring of hemodynamics  Assess ECHO to r/o other source of infiltrates / hypoxemia   RENAL A:   Lactic Acidosis - resolved  P:   Trend BMP / UOP  Replace electrolytes as indicated   GASTROINTESTINAL A:   Hx Vomiting / Viral Illness ? Aspiration Event  P:   NPO x ice chips Aspiration precautions   HEMATOLOGIC A:   Leukocytosis - in setting of suspected sepsis  P:  Monitor CBC Lovenox for DVT prophylaxis   INFECTIOUS A:   Diffuse Bilateral Infiltrates / CAP  Sepsis  P:   Trend PCT / Lactic acid Continue Rocephin / Azithro / tamiflu as above (in case flu PCR false negative) Follow cultures   ENDOCRINE A:   Mild Hyperglycemia    P:   Monitor glucose on BMP   NEUROLOGIC A:   Acute Encephalopathy - suspect related to hypoxia Note Hx of Zubsolv Use - buprenorphine / naloxone, ? Hx of prior drug use?? P:   RASS goal: n/a Consider CT head to r/o other etiology  UDS 12/5 >>   FAMILY  - Updates: Family (mother, wife, step-dad) updated at bedside 12/5  Canary Brim, NP-C [place] [facility] Pgr: [phone] or if no answer [phone] 03/18/2015, 12:49 PM    Attending Note:  I have examined patient, reviewed labs, studies and notes. I have discussed the case with B Ollis, and I agree with the data and plans as amended above. Pt appears stable but tenuous. His trajectory has been one of worsening clinical status, hypoxia and progressive infiltrates. i believe he needs to be watched in the ICU to insure that his progression plateaus. Hopefully can avoid intubation. If he does get intubated then would perform FOB for cx's, cell count, cytology, etc.    Levy Pupa, MD,  PhD 03/18/2015, 2:46 PM [place] [facility] [phone] or if no answer [phone]

## 2015-03-18 NOTE — Progress Notes (Signed)
RN called RT to patient room due to patient having increased heart rate, respiratory rate, use of accessory muscles, and work of breathing.  Patient placed on Bipap and patient is currently tolerating well.  Will continue to monitor.

## 2015-03-18 NOTE — Progress Notes (Addendum)
PROGRESS NOTE    Luke [name] ZOX:[id number] DOB: 08-18-1980 DOA: 03/17/2015 PCP: Lavell Islam, MD  HPI/Brief narrative 34 year old male with history of anxiety, chronic knee pain, narcotic addiction, presented to [facility] ED from [place] urgent care on 03/17/15 with sepsis features and bilateral community-acquired pneumonia. He presented with 4-5 days history of myalgias, fever, vomiting, recent onset of fevers, some confusion, dyspnea and productive cough. Assessed with sepsis secondary to multifocal pneumonia and acute hypoxic respiratory failure. Admitted to stepdown unit for close monitoring and management. CCM consulted. Continues to be hypoxic on Ventimask, chest x-ray worsening, concern for progressive respiratory failure that may require ventilatory support. CCM transferring patient to ICU 12/5.  Assessment/Plan:  Severe sepsis secondary to multifocal pneumonia - Treated per sepsis protocol with aggressive IV fluid hydration and IV antibiotics (Rocephin + azithromycin) and Tamiflu - Urine Legionella antigen: Pending, urine strep antigen: Negative, influenza panel PCR: negative, HIV antibody: Pending, pro-calcitonin 0.69, blood cultures 2: Pending - Improving. Sepsis physiology improved i.e. normotensive, tachycardia resolved, defervescesing, lactate normalized  Multifocal community-acquired pneumonia - Management as above. Continue IV Rocephin and azithromycin - Transition to oral antibiotics in approximately 48 hours pending further improvement - Needs follow-up chest x-ray in 3-4 weeks to insure resolution of pneumonia findings - Chest x-ray 12/5: Worsening bilateral lower lung pneumonia now with small-to-moderate bilateral pleural effusions.  Acute respiratory failure with hypoxia - Secondary to multifocal community-acquired pneumonia. Treatment as above for underlying pneumonia. Titrate oxygen to maintain saturations greater than 92%. - Continues to be hypoxic on Ventimask, chest  x-ray worsening, concern for progressive respiratory failure that may require ventilatory support. CCM transferring patient to ICU 12/5. Patient made nothing by mouth by CCM.  Lactic acidosis - Resolved.  Hypokalemia - Replaced  Anemia - May be dilutional. Follow CBCs in a.m.  Anxiety - Continue home dose of Xanax, duloxetine - Stable  Dehydration with mild hyponatremia - Improving. Continue additional 24 hours of IV fluids  Narcotic addiction/chronic pain - Continue home meds   DVT prophylaxis: Lovenox Code Status: Full Family Communication: None at bedside Disposition Plan: DC home when medically stable, probably in 3-4 days. Continue management in stepdown unit for now.   Consultants:  CCM  Procedures:  None  Antibiotics:  IV azithromycin 12/4 >  IV Rocephin 12/4 >  Oral Tamiflu 12/4 >  Subjective: Overall feels better. Unable to take deep breaths due to coughing spells. Denies chest pain. Feels a little stronger. Unable to bring up phlegm.  Objective: Filed Vitals:   03/18/15 0406 03/18/15 0700 03/18/15 0800 03/18/15 1143  BP: 111/75  104/67 111/72  Pulse: 98  96   Temp:  101.9 F (38.8 C)  99.7 F (37.6 C)  TempSrc:  Oral  Oral  Resp: 36  27   Height:      Weight:      SpO2: 92%  97%     Intake/Output Summary (Last 24 hours) at 03/18/15 1235 Last data filed at 03/18/15 0400  Gross per 24 hour  Intake   1050 ml  Output      0 ml  Net   1050 ml   Filed Weights   03/17/15 1046 03/17/15 1511  Weight: 72.576 kg (160 lb) 73.8 kg (162 lb 11.2 oz)     Exam:  General exam: Pleasant young male lying comfortably propped up in bed with no distress Respiratory system: Reduced breath sounds in the bases with occasional basal crackles. Rest of lung fields clear to auscultation. No  increased work of breathing. Cardiovascular system: S1 & S2 heard, RRR. No JVD, murmurs, gallops, clicks or pedal edema. Telemetry: Sinus rhythm. Gastrointestinal  system: Abdomen is nondistended, soft and nontender. Normal bowel sounds heard. Central nervous system: Alert and oriented. No focal neurological deficits. Extremities: Symmetric 5 x 5 power.   Data Reviewed: Basic Metabolic Panel:  Recent Labs Lab 03/17/15 1134 03/17/15 1354 03/18/15 0458  NA 135 133* 135  K 3.7 3.3* 3.7  CL 97* 99* 105  CO2 GLUCOSE 136* 158* 135*  BUN CREATININE 1.10 1.06 0.80  CALCIUM 8.7* 7.8* 7.5*   Liver Function Tests:  Recent Labs Lab 03/17/15 1354  AST 33  ALT 41  ALKPHOS 55  BILITOT 0.6  PROT 5.7*  ALBUMIN 2.7*   No results for input(s): LIPASE, AMYLASE in the last 168 hours. No results for input(s): AMMONIA in the last 168 hours. CBC:  Recent Labs Lab 03/17/15 0949 03/17/15 1134 03/18/15 0458  WBC 11.2* 9.8 7.6  NEUTROABS  --  8.8*  --   HGB 14.6 14.0 11.9*  HCT 41.3* 41.1 34.0*  MCV 97.3* 98.1 97.7  PLT  --  178 156   Cardiac Enzymes: No results for input(s): CKTOTAL, CKMB, CKMBINDEX, TROPONINI in the last 168 hours. BNP (last 3 results) No results for input(s): PROBNP in the last 8760 hours. CBG: No results for input(s): GLUCAP in the last 168 hours.  Recent Results (from the past 240 hour(s))  Blood Culture (routine x 2)     Status: None (Preliminary result)   Collection Time: 03/17/15  1:37 PM  Result Value Ref Range Status   Specimen Description BLOOD RIGHT ANTECUBITAL  Final   Special Requests   Final    BOTTLES DRAWN AEROBIC AND ANAEROBIC 10CC ROCEPHIN,ZITHROMAX   Culture PENDING  Incomplete   Report Status PENDING  Incomplete  Blood Culture (routine x 2)     Status: None (Preliminary result)   Collection Time: 03/17/15  1:43 PM  Result Value Ref Range Status   Specimen Description BLOOD RIGHT HAND  Final   Special Requests BOTTLES DRAWN AEROBIC ONLY 10CC ROCEPHIN,ZITHROMAX  Final   Culture PENDING  Incomplete   Report Status PENDING  Incomplete  MRSA PCR Screening     Status: None    Collection Time: 03/17/15  4:27 PM  Result Value Ref Range Status   MRSA by PCR NEGATIVE NEGATIVE Final    Comment:        The GeneXpert MRSA Assay (FDA approved for NASAL specimens only), is one component of a comprehensive MRSA colonization surveillance program. It is not intended to diagnose MRSA infection nor to guide or monitor treatment for MRSA infections.            Studies: Dg Chest 2 View  03/17/2015  CLINICAL DATA:  Cough.  Fever. EXAM: CHEST  2 VIEW COMPARISON:  None. FINDINGS: Normal heart size. Normal mediastinal contour. No pneumothorax. No pleural effusion. There is severe patchy consolidation in the lingula and both lower lobes. There is mild patchy consolidation in the parahilar left upper lobe. IMPRESSION: Severe patchy consolidation in both lower lobes, lingula and left upper lobe, most suggestive of a multilobar pneumonia. Recommend follow-up PA and lateral post treatment chest radiographs in 6-8 weeks. Electronically Signed   By: Delbert Phenix M.D.   On: 03/17/2015 10:03   Dg Chest Port 1 View  03/18/2015  CLINICAL DATA:  34 year old male with increased shortness of breath.  Fever cough and congestion. Initial encounter. EXAM: PORTABLE CHEST 1 VIEW COMPARISON:  03/17/2015. FINDINGS: Portable AP semi upright view at 1137 hours. Progressed the bilateral confluent and veiling pulmonary opacity involving the mid and lower lungs. The diaphragm now is obscured. Stable cardiac size and mediastinal contours. Visualized tracheal air column is within normal limits. Lung apices remain clear. No pneumothorax. No acute osseous abnormality identified. IMPRESSION: Radiographically progressed bilateral lower lung pneumonia, now with small to moderate bilateral pleural effusions. Electronically Signed   By: Odessa FlemingH  Hall M.D.   On: 03/18/2015 12:21        Scheduled Meds: . ALPRAZolam  1 mg Oral Daily  . antiseptic oral rinse  7 mL Mouth Rinse BID  . azithromycin  500 mg Intravenous  Q24H  . Buprenorphine HCl-Naloxone HCl  0.5-1 each Sublingual BID  . cefTRIAXone (ROCEPHIN)  IV  1 g Intravenous Q24H  . DULoxetine  60 mg Oral Daily  . enoxaparin (LOVENOX) injection  40 mg Subcutaneous Q24H  . guaiFENesin  1,200 mg Oral BID  . oseltamivir  75 mg Oral BID  . sodium chloride  3 mL Intravenous Q12H   Continuous Infusions: . sodium chloride 100 mL/hr at 03/18/15 0700    Principal Problem:   Sepsis (HCC) Active Problems:   Chronic knee pain   Anxiety disorder   Acute respiratory failure with hypoxia (HCC)   Bilateral pneumonia   PNA (pneumonia)    Time spent: 45 minutes.    Marcellus ScottHONGALGI,Evyn Kooyman, MD, FACP, FHM. [facility] Pager [phone]  If 7PM-7AM, please contact night-coverage [email or web address] Password [email or web address] 03/18/2015, 12:35 PM    LOS: 1 day

## 2015-03-18 NOTE — Progress Notes (Signed)
Report called to Albin Fellingarla, RN for 2 S room 4.

## 2015-03-19 ENCOUNTER — Inpatient Hospital Stay (HOSPITAL_COMMUNITY): Payer: BLUE CROSS/BLUE SHIELD

## 2015-03-19 DIAGNOSIS — R0600 Dyspnea, unspecified: Secondary | ICD-10-CM

## 2015-03-19 LAB — CBC WITH DIFFERENTIAL/PLATELET
BASOS ABS: 0 10*3/uL (ref 0.0–0.1)
Basophils Relative: 0 %
EOS PCT: 0 %
Eosinophils Absolute: 0 10*3/uL (ref 0.0–0.7)
HEMATOCRIT: 33.8 % — AB (ref 39.0–52.0)
Hemoglobin: 11.2 g/dL — ABNORMAL LOW (ref 13.0–17.0)
LYMPHS ABS: 0.6 10*3/uL — AB (ref 0.7–4.0)
LYMPHS PCT: 9 %
MCH: 33 pg (ref 26.0–34.0)
MCHC: 33.1 g/dL (ref 30.0–36.0)
MCV: 99.7 fL (ref 78.0–100.0)
MONO ABS: 0.3 10*3/uL (ref 0.1–1.0)
MONOS PCT: 5 %
NEUTROS ABS: 5.7 10*3/uL (ref 1.7–7.7)
Neutrophils Relative %: 86 %
PLATELETS: 175 10*3/uL (ref 150–400)
RBC: 3.39 MIL/uL — ABNORMAL LOW (ref 4.22–5.81)
RDW: 13.7 % (ref 11.5–15.5)
WBC: 6.7 10*3/uL (ref 4.0–10.5)

## 2015-03-19 LAB — RENAL FUNCTION PANEL
ALBUMIN: 2.3 g/dL — AB (ref 3.5–5.0)
ANION GAP: 6 (ref 5–15)
BUN: 11 mg/dL (ref 6–20)
CHLORIDE: 103 mmol/L (ref 101–111)
CO2: 28 mmol/L (ref 22–32)
Calcium: 8 mg/dL — ABNORMAL LOW (ref 8.9–10.3)
Creatinine, Ser: 0.72 mg/dL (ref 0.61–1.24)
GFR calc Af Amer: >60 mL/min (ref >60)
GFR calc non Af Amer: >60 mL/min (ref >60)
GLUCOSE: 123 mg/dL — AB (ref 65–99)
PHOSPHORUS: 1.5 mg/dL — AB (ref 2.5–4.6)
POTASSIUM: 4.1 mmol/L (ref 3.5–5.1)
Sodium: 137 mmol/L (ref 135–145)

## 2015-03-19 LAB — EXPECTORATED SPUTUM ASSESSMENT W GRAM STAIN, RFLX TO RESP C

## 2015-03-19 LAB — MAGNESIUM: Magnesium: 2 mg/dL (ref 1.7–2.4)

## 2015-03-19 LAB — EXPECTORATED SPUTUM ASSESSMENT W REFEX TO RESP CULTURE

## 2015-03-19 NOTE — Progress Notes (Signed)
Sign off note  Patient was transferred to the ICU under the care of the PCCM team on 03/18/2015. Discussed with PCCM/Ms. Canary BrimBrandi Ollis on 12/5. TRH will sign off. Please contact for further assistance.  Marcellus ScottHONGALGI,ANAND, MD, FACP, FHM. [facility] Pager [phone]  If 7PM-7AM, please contact night-coverage [email or web address] Password [email or web address] 03/19/2015, 7:43 AM

## 2015-03-19 NOTE — Progress Notes (Signed)
Patient currently on 55% venturi mask and tolerating well.  Will continue to monitor to assess for Bipap needs.

## 2015-03-19 NOTE — Progress Notes (Signed)
Took pt. Off bipap for a break. Pt. Requested to come off the bipap. Pt. Placed on venturi mask. Tolerating well at this time.

## 2015-03-19 NOTE — Progress Notes (Signed)
  Echocardiogram 2D Echocardiogram has been performed.  Luke [name], Luke [name] 03/19/2015, 12:43 PM

## 2015-03-19 NOTE — Progress Notes (Signed)
Name: Luke [name] MRN: [id number] DOB: 1980-04-20    ADMISSION DATE:  03/17/2015 CONSULTATION DATE:  03/17/2015  REFERRING MD:  Marlin Canary, D.O.  CHIEF COMPLAINT:  Multifocal Pneumonia with Acute Hypoxic Respiratory Failure  SUBJECTIVE:  Reports breathing is improving. Denies any chest pain or pressure. Cough nonproductive. Weaned off BiPAP to Venti mask.  REVIEW OF SYSTEMS:  No nausea or emesis. No abdominal pain. No subjective chills or sweats.  VITAL SIGNS: Temp:  [97.5 F (36.4 C)-102.2 F (39 C)] 101.4 F (38.6 C) (12/06 1106) Pulse Rate:  [66-106] 79 (12/06 1200) Resp:  [11-30] 26 (12/06 1200) BP: (101-124)/(67-83) 101/71 mmHg (12/06 1200) SpO2:  [90 %-100 %] 91 % (12/06 1200) FiO2 (%):  [55 %-60 %] 55 % (12/06 0800)  PHYSICAL EXAMINATION: General:  Awake. Alert. No acute distress.  Integument:  Warm & dry. No rash on exposed skin. No bruising. Lymphatics:  No appreciated cervical or supraclavicular lymphadenoapthy.  Cardiovascular:  Regular rate. No edema. No appreciable JVD.  Pulmonary: Clear bilaterally to auscultation. Normal work of breathing on Ventimask.  Abdomen: Soft. Normal bowel sounds. Nontender. Neurological:  Oriented x4. Grossly nonfocal.   Recent Labs Lab 03/17/15 1354 03/18/15 0458 03/19/15 1025  NA 133* 135 137  K 3.3* 3.7 4.1  CL 99* 105 103  CO2 BUN CREATININE 1.06 0.80 0.72  GLUCOSE 158* 135* 123*    Recent Labs Lab 03/17/15 1134 03/18/15 0458 03/19/15 1025  HGB 14.0 11.9* 11.2*  HCT 41.1 34.0* 33.8*  WBC 9.8 7.6 6.7  PLT 178 156 175   Dg Chest Port 1 View  03/18/2015  CLINICAL DATA:  34 year old male with increased shortness of breath. Fever cough and congestion. Initial encounter. EXAM: PORTABLE CHEST 1 VIEW COMPARISON:  03/17/2015. FINDINGS: Portable AP semi upright view at 1137 hours. Progressed the bilateral confluent and veiling pulmonary opacity involving the mid and lower lungs. The diaphragm now  is obscured. Stable cardiac size and mediastinal contours. Visualized tracheal air column is within normal limits. Lung apices remain clear. No pneumothorax. No acute osseous abnormality identified. IMPRESSION: Radiographically progressed bilateral lower lung pneumonia, now with small to moderate bilateral pleural effusions. Electronically Signed   By: Odessa Fleming M.D.   On: 03/18/2015 12:21    SIGNIFICANT EVENTS  12/4 - Admit to Hospital 12/5 -  Transfer to ICU for increase O2 needs / accessory muscle use  STUDIES:  CXR PA/LAT 12/4: Patchy opacification RLL, LLL, Lingula, & LUL. No pleural effusion. Heart normal in size. Mediastinum normal in contour.  MICROBIOLOGY: Sputum 12/4 >> Blood x2 12/4 >> HIV 12/4 >> U Legionella Ag 12/4 >> U Strep Ag 12/4:  Negative Influenza PCR 12/4:  Negative   ANTIBIOTICS: Rocephin 12/4 >> Azithromycin 12/4 >> Tamiflu 12/4 - 12/6  ASSESSMENT / PLAN:  PULMONARY A: Severe CAP - Multifocal pneumonia. Acute Hypoxic Respiratory Failure  P:   Wean FiO2 for Sat >92% Mucinex / Delsym  CARDIOVASCULAR A:  Tachycardia - suspect reactive   P:  Monitor on telemetry TTE ordered  RENAL A:   Lactic Acidosis - Resolved   P:   Monitoring UOP  Trending renal function daily with BUN/Creatinine  GASTROINTESTINAL A:   Nausea w/ Emesis - Resolved.  P:   Clear liquid diet.   HEMATOLOGIC A:   Anemia - Mild. No signs of active bleeding. Leukocytosis - Resolved.  P:  Monitor Hgb daily w/ CBC Lovenox for DVT prophylaxis   INFECTIOUS  A:   Sepsis Severe CAP  P:   Trending Procalcitonin per algorithm Day #3 Rocephin & Azithromycin D/C Tamiflu Awaiting Respiratory Culture  ENDOCRINE A:   Mild Hyperglycemia     P:   Monitor glucose on BMP   NEUROLOGIC A:   Acute Encephalopathy - Suspect related to hypoxia. Resolved. Note Hx of Zubsolv Use - buprenorphine / naloxone, ? Hx of prior drug use??  P:   UDS positive for Benzos Monitor in  ICU   FAMILY  - Updates: No family at bedside today.  TODAY'S SUMMARY:  34 year old male with multifocal pneumonia. Continuing broad spectrum antibiotics. Respiratory status has stabilized. Discontinuing Tamiflu today.  I have spent a total of 32 minutes of critical care time today caring for the patient and review his electronic medical record.  Donna ChristenJennings E. Jamison NeighborNestor, M.D. [facility] Pager:  [phone] After 3pm or if no response, call [phone]  03/19/2015, 1:14 PM

## 2015-03-20 LAB — RENAL FUNCTION PANEL
ANION GAP: 5 (ref 5–15)
Albumin: 2.1 g/dL — ABNORMAL LOW (ref 3.5–5.0)
BUN: 7 mg/dL (ref 6–20)
CHLORIDE: 106 mmol/L (ref 101–111)
CO2: 27 mmol/L (ref 22–32)
Calcium: 7.6 mg/dL — ABNORMAL LOW (ref 8.9–10.3)
Creatinine, Ser: 0.71 mg/dL (ref 0.61–1.24)
GFR calc non Af Amer: >60 mL/min (ref >60)
Glucose, Bld: 113 mg/dL — ABNORMAL HIGH (ref 65–99)
PHOSPHORUS: 2.4 mg/dL — AB (ref 2.5–4.6)
Potassium: 3.8 mmol/L (ref 3.5–5.1)
Sodium: 138 mmol/L (ref 135–145)

## 2015-03-20 LAB — CBC WITH DIFFERENTIAL/PLATELET
BASOS PCT: 1 %
Basophils Absolute: 0.1 10*3/uL (ref 0.0–0.1)
EOS ABS: 0.1 10*3/uL (ref 0.0–0.7)
Eosinophils Relative: 1 %
HEMATOCRIT: 32.4 % — AB (ref 39.0–52.0)
HEMOGLOBIN: 10.6 g/dL — AB (ref 13.0–17.0)
LYMPHS PCT: 19 %
Lymphs Abs: 1.1 10*3/uL (ref 0.7–4.0)
MCH: 32.4 pg (ref 26.0–34.0)
MCHC: 32.7 g/dL (ref 30.0–36.0)
MCV: 99.1 fL (ref 78.0–100.0)
MONOS PCT: 9 %
Monocytes Absolute: 0.5 10*3/uL (ref 0.1–1.0)
NEUTROS ABS: 3.8 10*3/uL (ref 1.7–7.7)
Neutrophils Relative %: 70 %
Platelets: 190 10*3/uL (ref 150–400)
RBC: 3.27 MIL/uL — ABNORMAL LOW (ref 4.22–5.81)
RDW: 13.9 % (ref 11.5–15.5)
WBC: 5.6 10*3/uL (ref 4.0–10.5)

## 2015-03-20 LAB — LEGIONELLA PNEUMOPHILA SEROGP 1 UR AG: L. pneumophila Serogp 1 Ur Ag: NEGATIVE

## 2015-03-20 LAB — BRAIN NATRIURETIC PEPTIDE: B Natriuretic Peptide: 267.8 pg/mL — ABNORMAL HIGH (ref 0.0–100.0)

## 2015-03-20 LAB — MAGNESIUM: Magnesium: 1.9 mg/dL (ref 1.7–2.4)

## 2015-03-20 MED ORDER — FUROSEMIDE 10 MG/ML IJ SOLN
40.0000 mg | Freq: Four times a day (QID) | INTRAMUSCULAR | Status: DC
Start: 2015-03-20 — End: 2015-03-20
  Administered 2015-03-20: 40 mg via INTRAVENOUS
  Filled 2015-03-20: qty 4

## 2015-03-20 MED ORDER — CARVEDILOL 3.125 MG PO TABS
3.1250 mg | ORAL_TABLET | Freq: Two times a day (BID) | ORAL | Status: DC
  Administered 2015-03-20 – 2015-03-21 (×2): 3.125 mg via ORAL
  Filled 2015-03-20 (×3): qty 1

## 2015-03-20 MED ORDER — POTASSIUM CHLORIDE CRYS ER 20 MEQ PO TBCR
40.0000 meq | EXTENDED_RELEASE_TABLET | Freq: Once | ORAL | Status: AC
  Administered 2015-03-20: 40 meq via ORAL
  Filled 2015-03-20: qty 2

## 2015-03-20 MED ORDER — OSELTAMIVIR PHOSPHATE 75 MG PO CAPS
75.0000 mg | ORAL_CAPSULE | Freq: Two times a day (BID) | ORAL | Status: AC
  Administered 2015-03-20 – 2015-03-22 (×6): 75 mg via ORAL
  Filled 2015-03-20 (×8): qty 1

## 2015-03-20 MED ORDER — AZITHROMYCIN 500 MG PO TABS
500.0000 mg | ORAL_TABLET | Freq: Every day | ORAL | Status: AC
  Administered 2015-03-20 – 2015-03-23 (×4): 500 mg via ORAL
  Filled 2015-03-20 (×4): qty 1

## 2015-03-20 NOTE — Progress Notes (Signed)
Name: Luke [name] MRN: [id number] DOB: 1980-06-24    ADMISSION DATE:  03/17/2015 CONSULTATION DATE:  03/17/2015  REFERRING MD:  Marlin Canary, D.O.  CHIEF COMPLAINT:  Multifocal Pneumonia with Acute Hypoxic Respiratory Failure  SUBJECTIVE:  Reports breathing is about same. Did not notice much improvement after albuterol treatment. Reports he is coughing up some phlegm but is difficult. Reports fevers at night. Had 102.6 fever last night.   REVIEW OF SYSTEMS:  No nausea or emesis. No abdominal pain. No subjective chills or sweats.  VITAL SIGNS: Temp:  [98.9 F (37.2 C)-102.6 F (39.2 C)] 99 F (37.2 C) (12/07 0739) Pulse Rate:  [68-102] 98 (12/07 0900) Resp:  [16-29] 23 (12/07 0900) BP: (94-127)/(61-80) 127/76 mmHg (12/07 0900) SpO2:  [87 %-100 %] 87 % (12/07 0900) FiO2 (%):  [50 %] 50 % (12/07 0851)  PHYSICAL EXAMINATION: General: Young man sitting up in chair, alert, NAD  HEENT: [place]/AT, EOMI, sclera anicteric Lymphatics:  No appreciated cervical or supraclavicular lymphadenoapthy.  Cardiovascular:  RRR, no m/g/r Pulmonary: Diffuse crackles bilaterally, breaths non-labored on ventimask Abdomen: BS+, soft, non-tender  Neurological: alert and oriented x 3   Recent Labs Lab 03/18/15 0458 03/19/15 1025 03/20/15 0234  NA 135 137 138  K 3.7 4.1 3.8  CL 105 103 106  CO2 BUN CREATININE 0.80 0.72 0.71  GLUCOSE 135* 123* 113*    Recent Labs Lab 03/18/15 0458 03/19/15 1025 03/20/15 0234  HGB 11.9* 11.2* 10.6*  HCT 34.0* 33.8* 32.4*  WBC 7.6 6.7 5.6  PLT 156 175 190   Dg Chest Port 1 View  03/18/2015  CLINICAL DATA:  34 year old male with increased shortness of breath. Fever cough and congestion. Initial encounter. EXAM: PORTABLE CHEST 1 VIEW COMPARISON:  03/17/2015. FINDINGS: Portable AP semi upright view at 1137 hours. Progressed the bilateral confluent and veiling pulmonary opacity involving the mid and lower lungs. The diaphragm now is obscured.  Stable cardiac size and mediastinal contours. Visualized tracheal air column is within normal limits. Lung apices remain clear. No pneumothorax. No acute osseous abnormality identified. IMPRESSION: Radiographically progressed bilateral lower lung pneumonia, now with small to moderate bilateral pleural effusions. Electronically Signed   By: Odessa Fleming M.D.   On: 03/18/2015 12:21    SIGNIFICANT EVENTS  12/4 - Admit to Hospital 12/5 -  Transfer to ICU for increase O2 needs / accessory muscle use  STUDIES:  CXR PA/LAT 12/4: Patchy opacification RLL, LLL, Lingula, & LUL. No pleural effusion. Heart normal in size. Mediastinum normal in contour. pCXR 12/5>> progressed bilateral lower lung PNA w/ sm bilat pleural effusions   MICROBIOLOGY: Sputum 12/4 >> Blood x2 12/4 >> HIV 12/4 >> nonreactive U Legionella Ag 12/4 >> U Strep Ag 12/4:  Negative Influenza PCR 12/4:  Negative   ANTIBIOTICS: Rocephin 12/4 >> Azithromycin 12/4 >> Tamiflu 12/4 - 12/6, 12/7>>  ASSESSMENT / PLAN:  PULMONARY A: Severe CAP - Multifocal pneumonia, viral seems likely. Acute Hypoxic Respiratory Failure  P:   Wean FiO2 for Sat >92% Mucinex / Delsym Albuterol nebs PRN Antibiotics as below Restart Tamiflu for 5 days total (end date 12/9)  CARDIOVASCULAR A:  Tachycardia - suspect reactive, improving  Mildly reduced EF- Echo 12/6 shows EF 45-50%- myocarditis?   P:  Get EKG BNP Add Coreg 3.125 mg BID  Consider adding ACE Monitor on tele  RENAL A:   Lactic Acidosis - Resolved   P:   Monitoring UOP  Trending renal  function daily with BUN/Creatinine  GASTROINTESTINAL A:   Nausea w/ Emesis - Resolved.  P:   Advance diet as tolerated  Zofran PRN  HEMATOLOGIC A:   Anemia - Mild. No signs of active bleeding. Leukocytosis - Resolved.  P:  Monitor Hgb daily w/ CBC Lovenox for DVT prophylaxis   INFECTIOUS A:   Sepsis Severe CAP- suspect flu  P:   PCT not impressive 0.69> viral etiology? Day  #4 Rocephin & Azithromycin Awaiting Respiratory Culture- few gram+ cocci in pairs  Restart Tamiflu  ENDOCRINE A:   Mild Hyperglycemia     P:   Monitor glucose on BMP   NEUROLOGIC A:   Acute Encephalopathy - Suspect related to hypoxia. Resolved. Note Hx of Zubsolv Use - buprenorphine / naloxone, ? Hx of prior drug use??  P:   UDS positive for Benzos Monitor in ICU   FAMILY  - Updates: No family at bedside today.  TODAY'S SUMMARY:  34 year old man with sepsis secondary to a multifocal pneumonia. Sepsis improved and continuing antibiotics for CAP coverage and restarting Tamiflu as influenza seems likely.    Rich Numberarly Samyia Motter, MD, MPH Internal Medicine Resident, PGY-II Pager: [phone]

## 2015-03-21 ENCOUNTER — Inpatient Hospital Stay (HOSPITAL_COMMUNITY): Payer: BLUE CROSS/BLUE SHIELD

## 2015-03-21 DIAGNOSIS — J9601 Acute respiratory failure with hypoxia: Secondary | ICD-10-CM

## 2015-03-21 LAB — TROPONIN I

## 2015-03-21 MED ORDER — MAGNESIUM SULFATE 2 GM/50ML IV SOLN
2.0000 g | Freq: Once | INTRAVENOUS | Status: AC
  Administered 2015-03-21: 2 g via INTRAVENOUS
  Filled 2015-03-21: qty 50

## 2015-03-21 MED ORDER — FUROSEMIDE 10 MG/ML IJ SOLN
40.0000 mg | Freq: Two times a day (BID) | INTRAMUSCULAR | Status: DC
  Administered 2015-03-21 – 2015-03-24 (×6): 40 mg via INTRAVENOUS
  Filled 2015-03-21 (×6): qty 4

## 2015-03-21 MED ORDER — POTASSIUM CHLORIDE 20 MEQ/15ML (10%) PO SOLN
20.0000 meq | Freq: Two times a day (BID) | ORAL | Status: DC
  Administered 2015-03-21 (×2): 20 meq
  Filled 2015-03-21 (×3): qty 15

## 2015-03-21 NOTE — Consult Note (Signed)
Patient ID: Luke [name] MRN: [id number], DOB/AGE: April 28, 1980   Admit date: 03/17/2015   Primary Physician: Thurman Coyer, MD Primary Cardiologist: New  Pt. Profile:  34 year old male with no previous cardiac history admitted for bilateral pneumonia, found to have mildly reduced EF on echo of 45-50%. Cardiology consulted for possible viral cardiomyopathy/ myocarditis.  Problem List  Past Medical History  Diagnosis Date  . Anxiety     sees Dr. Chucky May   . Chronic knee pain     sees [employer] (methadone clinic)  . Neck pain   . Numbness on right side   . History of narcotic addiction [facility])     Past Surgical History  Procedure Laterality Date  . Knee surgery  1999 and 2000    per Dr. Amada Jupiter   . Inner ear surgery       Allergies  No Known Allergies  HPI  The patient is a 34 year old male admitted by internal medicine for sepsis in setting of bilateral pneumonia. Allergy is to consult for possible myocarditis. His partners of his dyspnea workup a 2-D echo was also obtained which revealed mildly reduced left ventricular systolic function with an estimated ejection fraction of 45-50%. Also mild diffuse hypokinesis with abnormal septal wall motion.  He has no prior cardiac history. His past medical history is significant for anxiety disorder, chronic pain and history of narcotic addiction. Per reports he started feeling poorly 2 days ago with myalgias followed by fever and vomiting. He also became disoriented and his wife encouraged him to go to a local urgent care for evaluation. There a CXR was obtained which demonstrated bilateral pneumonia. Subsequently he was referred to the [facility] ED for further evaluation and admission. He was admitted by internal medicine. This led to 2-D echo evaluation which demonstrated his abnormal LV function. The pericardium is normal in appearance. There is no mention of pericardial effusion. 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   The patient denies any PMH of HTN, HLD, DM and no tobacco history. He also denies any h/o CAD or SCD in any of his first degree relatives. He also denies any recent CP. No pleuritic symptoms. His only complaint is DOE and malaise.     Home Medications  Prior to Admission medications   Medication Sig Start Date End Date Taking? Authorizing Provider  ALPRAZolam Duanne Moron) 1 MG tablet Take 1 mg by mouth daily.    Yes Historical Provider, MD  Buprenorphine HCl-Naloxone HCl (ZUBSOLV) 5.7-1.4 MG SUBL Place 0.5-1 each under the tongue 2 (two) times daily.    Yes Historical Provider, MD  DULoxetine (CYMBALTA) 60 MG capsule Take 60 mg by mouth daily.   Yes Historical Provider, MD  ibuprofen (ADVIL,MOTRIN) 200 MG tablet Take 400 mg by mouth every 6 (six) hours as needed for fever (pain).   Yes Historical Provider, MD    Family History  Family History  Problem Relation Age of Onset  . Depression Father   . Bipolar disorder Mother   . Throat cancer Maternal Grandmother   . Heart attack Paternal Grandfather 6    Social History  Social History   Social History  . Marital Status: Single    Spouse Name: N/A  . Number of Children: 4  . Years of Education: Some colle   Occupational History  . Store Freight forwarder    Social History Main Topics  . Smoking status: Never Smoker   . Smokeless tobacco: Never  Used  . Alcohol Use: 0.0 oz/week    0 Standard drinks or equivalent per week     Comment: Social  . Drug Use: No  . Sexual Activity: Not on file   Other Topics Concern  . Not on file   Social History Narrative   Patient is married with 4 children. Originally from [place]. No mold or pet exposure. No tuberculosis exposure. No recent travel. Traveled to [place] 2 years ago.     Review of Systems General:  No chills, fever, night sweats or weight changes.  Cardiovascular:  No chest pain, dyspnea on exertion, edema,  orthopnea, palpitations, paroxysmal nocturnal dyspnea. Dermatological: No rash, lesions/masses Respiratory: No cough, dyspnea Urologic: No hematuria, dysuria Abdominal:   No nausea, vomiting, diarrhea, bright red blood per rectum, melena, or hematemesis Neurologic:  No visual changes, wkns, changes in mental status. All other systems reviewed and are otherwise negative except as noted above.  Physical Exam  Blood pressure 123/83, pulse 67, temperature 99.2 F (37.3 C), temperature source Axillary, resp. rate 18, height _0  (1.753 m), weight 162 lb 11.2 oz (73.8 kg), SpO2 95 %.  General: Pleasant, NAD Psych: Normal affect. Neuro: Alert and oriented X 3. Moves all extremities spontaneously. HEENT: Normal  Neck: Supple without bruits or JVD. Lungs:  Resp regular and unlabored, bilateral crackles posteriorly and anteriorly.   Heart: RRR no s3, s4, or murmurs. Abdomen: Soft, non-tender, non-distended, BS + x 4.  Extremities: No clubbing, cyanosis or edema. DP/PT/Radials 2+ and equal bilaterally.  Labs  Troponin (Point of Care Test) No results for input(s): TROPIPOC in the last 72 hours. No results for input(s): CKTOTAL, CKMB, TROPONINI in the last 72 hours. Lab Results  Component Value Date   WBC 5.6 03/20/2015   HGB 10.6* 03/20/2015   HCT 32.4* 03/20/2015   MCV 99.1 03/20/2015   PLT 190 03/20/2015    Recent Labs Lab 03/17/15 1354  03/20/15 0234  NA 133*  < > 138  K 3.3*  < > 3.8  CL 99*  < > 106  CO2 26  < > 27  BUN 17  < > 7  CREATININE 1.06  < > 0.71  CALCIUM 7.8*  < > 7.6*  PROT 5.7*  --   --   BILITOT 0.6  --   --   ALKPHOS 55  --   --   ALT 41  --   --   AST 33  --   --   GLUCOSE 158*  < > 113*  < > = values in this interval not displayed. Lab Results  Component Value Date   CHOL 134 03/15/2014   HDL 51.60 03/15/2014   LDLCALC 71 03/15/2014   TRIG 59.0 03/15/2014   No results found for: DDIMER   Radiology/Studies  Dg Chest 2 View  03/17/2015   CLINICAL DATA:  Cough.  Fever. EXAM: CHEST  2 VIEW COMPARISON:  None. FINDINGS: Normal heart size. Normal mediastinal contour. No pneumothorax. No pleural effusion. There is severe patchy consolidation in the lingula and both lower lobes. There is mild patchy consolidation in the parahilar left upper lobe. IMPRESSION: Severe patchy consolidation in both lower lobes, lingula and left upper lobe, most suggestive of a multilobar pneumonia. Recommend follow-up PA and lateral post treatment chest radiographs in 6-8 weeks. Electronically Signed   By: Ilona Sorrel M.D.   On: 03/17/2015 10:03   Dg Chest Port 1 View  03/18/2015  CLINICAL DATA:  34 year old male with  increased shortness of breath. Fever cough and congestion. Initial encounter. EXAM: PORTABLE CHEST 1 VIEW COMPARISON:  03/17/2015. FINDINGS: Portable AP semi upright view at 1137 hours. Progressed the bilateral confluent and veiling pulmonary opacity involving the mid and lower lungs. The diaphragm now is obscured. Stable cardiac size and mediastinal contours. Visualized tracheal air column is within normal limits. Lung apices remain clear. No pneumothorax. No acute osseous abnormality identified. IMPRESSION: Radiographically progressed bilateral lower lung pneumonia, now with small to moderate bilateral pleural effusions. Electronically Signed   By: Genevie Ann M.D.   On: 03/18/2015 12:21    ECG  NSR. No ischemia. No prior tracing for comparison  Echocardiogram  Study Conclusions  - Left ventricle: mild diffuse hypokinesis abnormal septal motion. The cavity size was mildly dilated. Wall thickness was normal. Systolic function was mildly reduced. The estimated ejection fraction was in the range of 45% to 50%. - Atrial septum: No defect or patent foramen ovale was identified.  Pericardium: The pericardium was normal in appearance.    ASSESSMENT AND PLAN  Principal Problem:   Sepsis ([place]) Active Problems:   Chronic knee pain    Anxiety disorder   Acute respiratory failure with hypoxia (HCC)   Bilateral pneumonia   PNA (pneumonia)   Bilateral pulmonary infiltrates on chest x-ray    1. Bilateral PNA: currently on antibiotics. IM managing.   2. Reduced LV Systolic Function: EF mildly impaired at 45-50%. Mild diffuse hypokinesis and abnormal septal motion also noted.  No prior study for comparison. This may potentially represent myocarditis. Would recommend cycling troponin to assess for any enzyme elevation. ? utility of checking inflammatory markers including ESR and CRP. Will defer cardiac MRI to Dr. Tamala Julian.      Signed, Lyda Jester, PA-C 03/21/2015, 12:50 PM

## 2015-03-21 NOTE — Progress Notes (Signed)
Name: Luke [name] MRN: [id number] DOB: Aug 06, 1980    ADMISSION DATE:  03/17/2015 CONSULTATION DATE:  03/17/2015  REFERRING MD:  Marlin Canary, D.O.  CHIEF COMPLAINT:  Multifocal Pneumonia with Acute Hypoxic Respiratory Failure  SIGNIFICANT EVENTS  12/4 - Admit to Hospital to [facility]. CXR PA/LAT 12/4: Patchy opacification RLL, LLL, Lingula, & LUL. No pleural effusion. Heart normal in size. Mediastinum normal in contour.  12/5 -  Transfer to ICU for increase O2 needs / accessory muscle use. pCXR 12/5>> progressed bilateral lower lung PNA w/ sm bilat pleural effusions  12/6 -e cho lvef 45%  03/20/15:  Reports breathing is about same. Did not notice much improvement after albuterol treatment. Reports he is coughing up some phlegm but is difficult. Reports fevers at night. Had 102.6 fever last night.  No nausea or emesis. No abdominal pain. No subjective chills or sweats.   SUBJECTIVE/OVERNIGHT/INTERVAL HX 03/21/15 : temp 12F x 24h, appears improved. wBC improved. Fiancee and mom at bedside - he reports frustration of slow improvement and physical deconditioning   VITAL SIGNS: Temp:  [98.6 F (37 C)-99.5 F (37.5 C)] 99.2 F (37.3 C) (12/08 0949) Pulse Rate:  [67-101] 67 (12/08 0949) Resp:  [16-22] 18 (12/07 2200) BP: (102-123)/(65-83) 123/83 mmHg (12/08 0949) SpO2:  [90 %-100 %] 95 % (12/08 0949) FiO2 (%):  [55 %] 55 % (12/07 2200)  PHYSICAL EXAMINATION: General: Young man sitting up in chair, alert, NAD . looKS DECONDITIONED HEENT: [place]/AT, EOMI, sclera anicteric Lymphatics:  No appreciated cervical or supraclavicular lymphadenoapthy.  Cardiovascular:  RRR, no m/g/r Pulmonary: Diffuse crackles bilaterally, breaths non-labored on ventimask. Bilateral lowe lobe air entry is ABSENT Abdomen: BS+, soft, non-tender  Neurological: alert and oriented x 3  PULMONARY  Recent Labs Lab 03/17/15 1209  PHART 7.462*  PCO2ART 37.2  PO2ART 61.0*  HCO3 26.2*  TCO2 27  O2SAT 90.0     CBC  Recent Labs Lab 03/18/15 0458 03/19/15 1025 03/20/15 0234  HGB 11.9* 11.2* 10.6*  HCT 34.0* 33.8* 32.4*  WBC 7.6 6.7 5.6  PLT 156 175 190    COAGULATION  Recent Labs Lab 03/17/15 1354  INR 1.32    CARDIAC  No results for input(s): TROPONINI in the last 168 hours. No results for input(s): PROBNP in the last 168 hours.   CHEMISTRY  Recent Labs Lab 03/17/15 1134 03/17/15 1354 03/18/15 0458 03/19/15 1025 03/20/15 0234  NA 135 133* 135 137 138  K 3.7 3.3* 3.7 4.1 3.8  CL 97* 99* 105 103 106  CO2 GLUCOSE 136* 158* 135* 123* 113*  BUN CREATININE 1.10 1.06 0.80 0.72 0.71  CALCIUM 8.7* 7.8* 7.5* 8.0* 7.6*  MG  --   --   --  2.0 1.9  PHOS  --   --   --  1.5* 2.4*   Estimated Creatinine Clearance: 128.9 mL/min (by C-G formula based on Cr of 0.71).   LIVER  Recent Labs Lab 03/17/15 1354 03/19/15 1025 03/20/15 0234  AST 33  --   --   ALT 41  --   --   ALKPHOS 55  --   --   BILITOT 0.6  --   --   PROT 5.7*  --   --   ALBUMIN 2.7* 2.3* 2.1*  INR 1.32  --   --      INFECTIOUS  Recent Labs Lab 03/17/15 1315 03/17/15 1354 03/17/15 1650  LATICACIDVEN 3.01* 3.1*  1.9  PROCALCITON  --  0.69  --      ENDOCRINE CBG (last 3)  No results for input(s): GLUCAP in the last 72 hours.       IMAGING x48h  - image(s) personally visualized  -   highlighted in bold No results found.  Last done 03/18/15 - visualized personally . Diffuse bialteral LL air space dz   ASSESSMENT / PLAN:  PULMONARY A: Severe CAP - Multifocal pneumonia, viral seems likely. Acute Hypoxic Respiratory Failure. Flu PCR negative  - still on high flow o2. Easy desats reported per family 03/21/15. Not using his IS. Last CXR 12/5/165  P:   Wean FiO2 for Sat >92% CT chest wo contrast  - eval for empyema/pleural effusion Advised aggressive IS + patiennce Mucinex / Delsym Albuterol nebs PRN Antibiotics as below Diurese - see  cards  CARDIOVASCULAR A:  Mildly reduced EF- Echo 12/6 shows EF 45-50%- myocarditis?  Possible super-imposed acute systolic CHF  P:  DC Coreg -> start diuresis  Cards consult Monitor on tele  RENAL A:   Lactic Acidosis - Resolved  Mild hypomag 03/20/15   P:   Replete mag Recheck bmet, mag, phos daily Monitoring UOP  Saline lock IV and aim   GASTROINTESTINAL A:   Nausea w/ Emesis - Resolved.  P:   Advance diet as tolerated  Zofran PRN  HEMATOLOGIC A:   Anemia - Mild. No signs of active bleeding. Leukocytosis - Resolved.  P:  Monitor Hgb daily w/ CBC Lovenox for DVT prophylaxis   INFECTIOUS MICROBIOLOGY: Sputum 12/4 >> Blood x2 12/4 >> HIV 12/4 >> nonreactive U Legionella Ag 12/4 >> U Strep Ag 12/4:  Negative Influenza PCR 12/4:  Negative  RVP pcr 03/21/15 >>  A:   Sepsis Severe CAP- suspect flu  P:   ANTIBIOTICS: Rocephin 12/4 >> Azithromycin 12/4 >> Tamiflu 12/4 - 12/6, 12/7>> (12/9)  ENDOCRINE A:   Mild Hyperglycemia     P:   Monitor glucose on BMP   NEUROLOGIC A:   Acute Encephalopathy - Suspect related to hypoxia. Resolved. Note Hx of Zubsolv Use - buprenorphine / naloxone, ? Hx of prior drug use??  P:   UDS positive for Benzos Monitor in SDU   FAMILY  - Updates mom, fiancee and patient updated at bedisde   GLBOAL Get ct chest. Call cards. [facility] 03/22/15 with PCCM as consult - call placed to Dr Joseph ArtWoods - awaiting responmse    Dr. Kalman ShanMurali Saylah Ketner, M.D., [facility].C.P Pulmonary and Critical Care Medicine Staff Physician [facility] [place] [facility] Pager: [phone], If no answer or between  15:00h - 7:00h: call [phone]  03/21/2015 11:00 AM

## 2015-03-22 ENCOUNTER — Telehealth: Payer: Self-pay | Admitting: Internal Medicine

## 2015-03-22 DIAGNOSIS — J9600 Acute respiratory failure, unspecified whether with hypoxia or hypercapnia: Secondary | ICD-10-CM | POA: Diagnosis present

## 2015-03-22 DIAGNOSIS — F419 Anxiety disorder, unspecified: Secondary | ICD-10-CM

## 2015-03-22 LAB — CBC WITH DIFFERENTIAL/PLATELET
BASOS ABS: 0 10*3/uL (ref 0.0–0.1)
Basophils Relative: 0 %
Eosinophils Absolute: 0.5 10*3/uL (ref 0.0–0.7)
Eosinophils Relative: 8 %
HCT: 32 % — ABNORMAL LOW (ref 39.0–52.0)
HEMOGLOBIN: 11.2 g/dL — AB (ref 13.0–17.0)
LYMPHS ABS: 1.5 10*3/uL (ref 0.7–4.0)
LYMPHS PCT: 25 %
MCH: 34.3 pg — ABNORMAL HIGH (ref 26.0–34.0)
MCHC: 35 g/dL (ref 30.0–36.0)
MCV: 97.9 fL (ref 78.0–100.0)
MONOS PCT: 7 %
Monocytes Absolute: 0.4 10*3/uL (ref 0.1–1.0)
Neutro Abs: 3.4 10*3/uL (ref 1.7–7.7)
Neutrophils Relative %: 60 %
PLATELETS: 258 10*3/uL (ref 150–400)
RBC: 3.27 MIL/uL — AB (ref 4.22–5.81)
RDW: 13.7 % (ref 11.5–15.5)
WBC: 5.8 10*3/uL (ref 4.0–10.5)

## 2015-03-22 LAB — CULTURE, RESPIRATORY W GRAM STAIN

## 2015-03-22 LAB — CULTURE, BLOOD (ROUTINE X 2)
Culture: NO GROWTH
Culture: NO GROWTH

## 2015-03-22 LAB — PHOSPHORUS: PHOSPHORUS: 5 mg/dL — AB (ref 2.5–4.6)

## 2015-03-22 LAB — BASIC METABOLIC PANEL
ANION GAP: 5 (ref 5–15)
BUN: 10 mg/dL (ref 6–20)
CALCIUM: 8.4 mg/dL — AB (ref 8.9–10.3)
CHLORIDE: 105 mmol/L (ref 101–111)
CO2: 31 mmol/L (ref 22–32)
Creatinine, Ser: 0.72 mg/dL (ref 0.61–1.24)
GFR calc non Af Amer: >60 mL/min (ref >60)
Glucose, Bld: 111 mg/dL — ABNORMAL HIGH (ref 65–99)
Potassium: 4.2 mmol/L (ref 3.5–5.1)
Sodium: 141 mmol/L (ref 135–145)

## 2015-03-22 LAB — CULTURE, RESPIRATORY

## 2015-03-22 LAB — MAGNESIUM: Magnesium: 1.9 mg/dL (ref 1.7–2.4)

## 2015-03-22 LAB — TROPONIN I: Troponin I: <0.03 ng/mL (ref <0.031)

## 2015-03-22 LAB — SEDIMENTATION RATE: SED RATE: 42 mm/h — AB (ref 0–16)

## 2015-03-22 LAB — HIGH SENSITIVITY CRP: CRP, High Sensitivity: 135 mg/L — ABNORMAL HIGH (ref 0.00–3.00)

## 2015-03-22 MED ORDER — POTASSIUM CHLORIDE CRYS ER 20 MEQ PO TBCR
20.0000 meq | EXTENDED_RELEASE_TABLET | Freq: Two times a day (BID) | ORAL | Status: DC
  Administered 2015-03-22 – 2015-03-24 (×5): 20 meq via ORAL
  Filled 2015-03-22 (×5): qty 1

## 2015-03-22 MED ORDER — ALPRAZOLAM 0.5 MG PO TABS
1.0000 mg | ORAL_TABLET | Freq: Every day | ORAL | Status: DC
  Administered 2015-03-22 – 2015-03-23 (×2): 1 mg via ORAL
  Filled 2015-03-22 (×2): qty 2

## 2015-03-22 NOTE — Progress Notes (Signed)
Pt [facility] to 6E-22 [facility] with NA. Belongings transferred with pt and fiance' notified. Report called to Carrie MewJasmine Adams, RN with all questions answered per receiving RN, Leavy CellaJasmine.

## 2015-03-22 NOTE — Progress Notes (Addendum)
Arrival Method: via wheelchair Mental Status: alert and oriented x4 Telemetry: n/a Tubes: n/a IV: LAC, normal saline locked Pain: denies Family: none present Living Situation: home  Safety Measures: pt wearing non skid socks 6E Orientation: oriented to staff and unit  Will continue to monitor.

## 2015-03-22 NOTE — Telephone Encounter (Signed)
Triage  Give him Luke [name] Nov 30, 1980 with [address] [place] [id number] office visit with TP first avail for fu from CAP and acute hyppxemic resp failure  Dr. Kalman ShanMurali Zacarias Krauter, M.D., [facility].C.C.P Pulmonary and Critical Care Medicine Staff Physician [facility] [place] [facility] Pager: [phone], If no answer or between  15:00h - 7:00h: call [phone]  03/22/2015 5:36 PM

## 2015-03-22 NOTE — Progress Notes (Signed)
RN asked RT to place pt on HFNC but no order found and Pt spo2 94%. Pt on VM due to stating his nose was burning from the [place]. RT placed pt on 6L [place] with water bottle attatched. Pt turned down to 4L [place] after spo2 remained >95% on 6L. Pt states his nose feels like it is burning but its tolerable. RN made aware. RT will continue to monitor.

## 2015-03-22 NOTE — Progress Notes (Signed)
Name: Luke [name] MRN: [id number] DOB: December 09, 1980    ADMISSION DATE:  03/17/2015 CONSULTATION DATE:  03/17/2015  REFERRING MD:  Marlin Canary, D.O.  CHIEF COMPLAINT:  Multifocal Pneumonia with Acute Hypoxic Respiratory Failure  SIGNIFICANT EVENTS / STUDIES:  12/4  Admit to Hospital to [facility].  12/4 CXR PA/LAT >>Patchy opacification RLL, LLL, Lingula, & LUL. No pleural effusion. Heart normal in size. Mediastinum normal in contour. 12/5  Transfer to ICU for increase O2 needs / accessory muscle use.  12/5  pCXR 12/5 >> progressed bilateral lower lung PNA w/ sm bilat pleural effusions 12/6  ECHO lvef 45% 12/7  Reports breathing is about same. Did not notice much improvement after albuterol treatment. Reports he is coughing up some phlegm but is difficult. Reports fevers at night. Had 102.6 fever last night.  No nausea or emesis. No abdominal pain. No subjective chills or sweats. 12/8  Tmax 99, improving, WBC improved 12/8  CT Chest >> small bilateral pleural effusions R>L, patchy bilateral airspace opacification, mildly prominent mediastinal nodes  12/9  Much improved!  O2 down to 2L   SUBJECTIVE:  Pt reports improvement, notes some residual non-productive cough.     VITAL SIGNS: Temp:  [97.5 F (36.4 C)-98 F (36.7 C)] 98 F (36.7 C) (12/09 1148) Pulse Rate:  [47-75] 61 (12/09 1148) Resp:  [13-25] 13 (12/09 1148) BP: (96-126)/(72-84) 119/83 mmHg (12/09 1148) SpO2:  [86 %-96 %] 92 % (12/09 1148)  PHYSICAL EXAMINATION: General: thin adult male in NAD, sitting up in bed HEENT: [place]/AT, EOMI, sclera anicteric Cardiovascular:  RRR, no m/g/r Pulmonary: even/non-labored, lungs bilaterally clear anterior, diminished lower posterior  Abdomen: BS+, soft, non-tender  Neurological: alert and oriented x 3, MAE   PULMONARY  Recent Labs Lab 03/17/15 1209  PHART 7.462*  PCO2ART 37.2  PO2ART 61.0*  HCO3 26.2*  TCO2 27  O2SAT 90.0    CBC  Recent Labs Lab 03/19/15 1025  03/20/15 0234 03/22/15 0340  HGB 11.2* 10.6* 11.2*  HCT 33.8* 32.4* 32.0*  WBC 6.7 5.6 5.8  PLT 175 190 258    COAGULATION  Recent Labs Lab 03/17/15 1354  INR 1.32    CARDIAC    Recent Labs Lab 03/21/15 1700 03/21/15 2307 03/22/15 0340  TROPONINI <0.03 <0.03 <0.03   No results for input(s): PROBNP in the last 168 hours.   CHEMISTRY  Recent Labs Lab 03/17/15 1354 03/18/15 0458 03/19/15 1025 03/20/15 0234 03/22/15 0340  NA 133* 135 137 138 141  K 3.3* 3.7 4.1 3.8 4.2  CL 99* 105 103 106 105  CO2 GLUCOSE 158* 135* 123* 113* 111*  BUN CREATININE 1.06 0.80 0.72 0.71 0.72  CALCIUM 7.8* 7.5* 8.0* 7.6* 8.4*  MG  --   --  2.0 1.9 1.9  PHOS  --   --  1.5* 2.4* 5.0*   Estimated Creatinine Clearance: 128.9 mL/min (by C-G formula based on Cr of 0.72).   LIVER  Recent Labs Lab 03/17/15 1354 03/19/15 1025 03/20/15 0234  AST 33  --   --   ALT 41  --   --   ALKPHOS 55  --   --   BILITOT 0.6  --   --   PROT 5.7*  --   --   ALBUMIN 2.7* 2.3* 2.1*  INR 1.32  --   --      INFECTIOUS  Recent Labs Lab 03/17/15 1315 03/17/15 1354 03/17/15 1650  LATICACIDVEN 3.01* 3.1* 1.9  PROCALCITON  --  0.69  --     ENDOCRINE CBG (last 3)  No results for input(s): GLUCAP in the last 72 hours.    IMAGING x48h  - image(s) personally visualized  -   highlighted in bold Ct Chest Wo Contrast  03/21/2015  CLINICAL DATA:  Acute onset of shortness of breath and respiratory failure. Initial encounter. EXAM: CT CHEST WITHOUT CONTRAST TECHNIQUE: Multidetector CT imaging of the chest was performed following the standard protocol without IV contrast. COMPARISON:  Chest radiograph performed 03/18/2015 FINDINGS: Small bilateral pleural effusions are noted, slightly larger on the right. Patchy bilateral airspace opacification is noted. This may reflect multifocal pneumonia or pulmonary edema. No pneumothorax is seen. No dominant masses are identified.  Mildly prominent mediastinal nodes are seen, measuring up to 1.3 cm in short axis. These are noted at the right paratracheal, azygoesophageal and aortopulmonary window regions. Trace pericardial fluid remains within normal limits. The great vessels are grossly unremarkable in appearance. The thyroid gland is unremarkable. No axillary lymphadenopathy seen. The visualized portions of the liver and spleen are unremarkable. The visualized portions of the pancreas, adrenal glands and kidneys are within normal limits. No acute osseous abnormalities are identified. IMPRESSION: 1. Small bilateral pleural effusions, slightly larger on the right. Patchy bilateral airspace opacification noted. This may reflect multifocal pneumonia or pulmonary edema. 2. Mildly prominent mediastinal nodes seen, measuring up to 1.3 cm in short axis. Electronically Signed   By: Roanna Raider M.D.   On: 03/21/2015 20:49   CT images from 12/8 personally reviewed - areas of bilateral airspace disease, some nodular in appearance, R>L effusion  ASSESSMENT / PLAN:  PULMONARY A: Severe CAP - Multifocal pneumonia, suspect viral etiology.  Acute Hypoxic Respiratory Failure. Flu PCR negative  P:   Wean FiO2 for Sat >92% Pulmonary hygiene:  Aggressive IS, mobilize as able Will need repeat CT in 4 weeks post completion of abx to review for resolution  Mucinex / Delsym Albuterol nebs PRN Antibiotics as below Diurese - see cards  CARDIOVASCULAR A:  Mildly reduced EF- Echo 12/6 shows EF 45-50%- myocarditis?  Possible super-imposed acute systolic CHF  P:  Diuresis as renal function / BP permit, Lasix 40 mg IV BID  Cardiology following - doubt's myocarditis. If further concerns, consider repeat ECHO 5-7 days after initial study Monitor on tele  RENAL A:   Lactic Acidosis - Resolved  Mild hypomag 03/20/15   P:   Monitor UOP / BMP  Saline lock IV   GASTROINTESTINAL A:   Nausea w/ Emesis - Resolved.  P:   Advance diet as  tolerated  Zofran PRN  HEMATOLOGIC A:   Anemia - Mild. No signs of active bleeding. Leukocytosis - Resolved.  P:  Monitor Hgb daily w/ CBC Lovenox for DVT prophylaxis   INFECTIOUS MICROBIOLOGY: Sputum 12/4 >> few yeast consistent with candida Blood x2 12/4 >> neg  HIV 12/4 >> nonreactive U Legionella Ag 12/4 >> not done U Strep Ag 12/4:  Negative Influenza PCR 12/4:  Negative  RVP pcr 03/21/15 >>   A:   Sepsis Severe CAP- suspect flu  P:   ANTIBIOTICS: Rocephin 12/4 >> Azithromycin 12/4 >>  Tamiflu 12/4 - 12/6, 12/7>> (12/9)  ENDOCRINE A:   Mild Hyperglycemia     P:   Monitor glucose on BMP   NEUROLOGIC A:   Acute Encephalopathy - Suspect related to hypoxia. Resolved. Depression  Note Hx of Zubsolv Use - buprenorphine / naloxone, ?  Hx of prior drug use??  P:   UDS positive for Benzos (on xanax while inpatient) Monitor in SDU, suspect he is nearing transfer to medical floor  Continue cymbalta  FAMILY  - Update:  No family at bedside.  Patient updated on status.      Canary BrimBrandi Anuj Summons, NP-C [facility] Pgr: [phone] or if no answer [phone] 03/22/2015, 3:55 PM

## 2015-03-22 NOTE — Progress Notes (Signed)
       Patient Name: Luke [name] Date of Encounter: 03/22/2015    SUBJECTIVE: There are no cardiac complaints  TELEMETRY:  Sinus rhythm Filed Vitals:   03/22/15 0000 03/22/15 0019 03/22/15 0400 03/22/15 0733  BP: 96/73  109/72 126/84  Pulse: 59  47 60  Temp: 97.8 F (36.6 C)  97.5 F (36.4 C) 97.6 F (36.4 C)  TempSrc: Axillary  Axillary Oral  Resp: 15  16 25   Height:      Weight:      SpO2: 86% 91% 95% 95%    Intake/Output Summary (Last 24 hours) at 03/22/15 1032 Last data filed at 03/22/15 0900  Gross per 24 hour  Intake    243 ml  Output      0 ml  Net    243 ml   LABS: Basic Metabolic Panel:  Recent Labs  16/01/9611/07/16 0234 03/22/15 0340  NA 138 141  K 3.8 4.2  CL 106 105  CO2 27 31  GLUCOSE 113* 111*  BUN 7 10  CREATININE 0.71 0.72  CALCIUM 7.6* 8.4*  MG 1.9 1.9  PHOS 2.4* 5.0*   CBC:  Recent Labs  03/20/15 0234 03/22/15 0340  WBC 5.6 5.8  NEUTROABS 3.8 3.4  HGB 10.6* 11.2*  HCT 32.4* 32.0*  MCV 99.1 97.9  PLT 190 258   Cardiac Enzymes:  Recent Labs  03/21/15 1700 03/21/15 2307 03/22/15 0340  TROPONINI <0.03 <0.03 <0.03   BNP    Component Value Date/Time   BNP 267.8* 03/20/2015 1200    ProBNP No results found for: PROBNP   Radiology/Studies:  No new data  Physical Exam: Blood pressure 126/84, pulse 60, temperature 97.6 F (36.4 C), temperature source Oral, resp. rate 25, height 5\' 9"  (1.753 m), weight 162 lb 11.2 oz (73.8 kg), SpO2 95 %. Weight change:   Wt Readings from Last 3 Encounters:  03/17/15 162 lb 11.2 oz (73.8 kg)  03/17/15 162 lb 3.2 oz (73.573 kg)  07/11/14 151 lb (68.493 kg)    No pericardial friction rub or ventricular gallop is heard. Neck veins are still moderately elevated Extremities reveal no edema  ASSESSMENT:  1. Bilateral pneumonia without obvious evidence of cardiogenic component. 2. Low normal left ventricular function. Doubt myocarditis. Inflammatory markers are significantly elevated due  to pulmonary process. BNP and troponin values are normal, which argues against the possibility of myocardial inflammation.  Plan:  If concern exists, repeat 2-D Doppler echocardiogram 5-7 days after the initial study. We will follow and advise as needed. Please call if questions. The patient will probably not be seen by cardiology over the weekend.  Selinda EonSigned, Lennie Dunnigan III,Alishea Beaudin W 03/22/2015, 10:32 AM

## 2015-03-22 NOTE — Progress Notes (Signed)
TEAM 1 - Stepdown/ICU TEAM PROGRESS NOTE  Saverio Dankerhomas W Bonsall GNF:[id number] DOB: [birth year] DOA: 03/17/2015 PCP: Lavell IslamLOWARD,DAVIS L, MD  Admit HPI / Brief Narrative: 34 y.o. Male with anxiety disorder, chronic pain, and a history of narcotic addiction who presented [facility] the ED [facility] with sepsis and bilateral pneumonia/pneumonitis. He started feeling poorly with myalgias, then developed a fever, and later began vomiting and was disoriented. [facility] obtained an x-ray that demonstrated bilateral pneumonia. He was sent to the emergency department for admission.   Significant Events: 12/4 - Admit to Hospital to [facility]. CXR PA/LAT 12/4: Patchy opacification RLL, LLL, Lingula, & LUL. No pleural effusion. Heart normal in size. Mediastinum normal in contour. 12/5 - Transfer to ICU for increase O2 needs / accessory muscle use. pCXR 12/5>> progressed bilateral lower lung PNA w/ sm bilat pleural effusions 12/6 - TTE EF 45%  HPI/Subjective: The patient is resting comfortably.  He states he feels much better today.  He is not yet been walking outside of his room due to his droplet isolation.  He denies chest pain nausea vomiting abdominal pain fevers or chills.  He is highly motivated to go home as soon as possible.  Assessment/Plan:  Severe CAP - Multifocal pneumonia - Acute Hypoxic Respiratory Failure ?viral etiology per Pulm - Tamiflu for 5 days total (end date 12/9) - CT chest w/o surprising findings - attempt to wean O2 as rapidly as able   Tachycardia suspect reactive - resolved   Mildly reduced EF TTE 12/6 shows EF 45-50% - Cardiology consulted - no evidence of myocarditis - to have f/u TTE in ~1 week if any concerning sx persist   Lactic Acidosis resolved   Anemia - Mild No signs of active bleeding - likely due to acute critical illness   Mild Hyperglycemia  Will check A1c in AM   Acute Encephalopathy Suspect related to hypoxia - resolved  ? Hx of  prior drug use UDS positive for Benzos  Code Status: FULL Family Communication: spoke w/ pt and wife at bedside  Disposition Plan: transfer to med bed - wean O2 - ambulate - hopeful for d/c in 24-48hrs  Consultants: [facility]   Antibiotics: Azithromycin 12/4 > 12/8 Ceftriaxone 12/4 >`  DVT prophylaxis: lovenox   Objective: Blood pressure 119/83, pulse 61, temperature 98 F (36.7 C), temperature source Oral, resp. rate 13, height 5\' 9"  (1.753 m), weight 73.8 kg (162 lb 11.2 oz), SpO2 92 %.  Intake/Output Summary (Last 24 hours) at 03/22/15 1445 Last data filed at 03/22/15 0900  Gross per 24 hour  Intake    243 ml  Output      0 ml  Net    243 ml   Exam: General: No acute respiratory distress at rest Lungs: Clear to auscultation bilaterally without wheezes or crackles Cardiovascular: Regular rate and rhythm without murmur gallop or rub normal S1 and S2 Abdomen: Nontender, nondistended, soft, bowel sounds positive, no rebound, no ascites, no appreciable mass Extremities: No significant cyanosis, clubbing, or edema bilateral lower extremities  Data Reviewed: Basic Metabolic Panel:  Recent Labs Lab 03/17/15 1354 03/18/15 0458 03/19/15 1025 03/20/15 0234 03/22/15 0340  NA 133* 135 137 138 141  K 3.3* 3.7 4.1 3.8 4.2  CL 99* 105 103 106 105  CO2 26 25 28 27 31   GLUCOSE 158* 135* 123* 113* 111*  BUN 17 11 11 7 10   CREATININE 1.06 0.80 0.72 0.71 0.72  CALCIUM 7.8* 7.5* 8.0*  7.6* 8.4*  MG  --   --  2.0 1.9 1.9  PHOS  --   --  1.5* 2.4* 5.0*    CBC:  Recent Labs Lab 03/17/15 1134 03/18/15 0458 03/19/15 1025 03/20/15 0234 03/22/15 0340  WBC 9.8 7.6 6.7 5.6 5.8  NEUTROABS 8.8*  --  5.7 3.8 3.4  HGB 14.0 11.9* 11.2* 10.6* 11.2*  HCT 41.1 34.0* 33.8* 32.4* 32.0*  MCV 98.1 97.7 99.7 99.1 97.9  PLT 178 156 175 190 258    Liver Function Tests:  Recent Labs Lab 03/17/15 1354 03/19/15 1025 03/20/15 0234  AST 33  --   --   ALT 41  --   --     ALKPHOS 55  --   --   BILITOT 0.6  --   --   PROT 5.7*  --   --   ALBUMIN 2.7* 2.3* 2.1*   Coags:  Recent Labs Lab 03/17/15 1354  INR 1.32    Recent Labs Lab 03/17/15 1354  APTT 38*    Cardiac Enzymes:  Recent Labs Lab 03/21/15 1700 03/21/15 2307 03/22/15 0340  TROPONINI <0.03 <0.03 <0.03    Recent Results (from the past 240 hour(s))  Blood Culture (routine x 2)     Status: None   Collection Time: 03/17/15  1:37 PM  Result Value Ref Range Status   Specimen Description BLOOD RIGHT ANTECUBITAL  Final   Special Requests   Final    BOTTLES DRAWN AEROBIC AND ANAEROBIC 10CC ROCEPHIN,ZITHROMAX   Culture NO GROWTH 5 DAYS  Final   Report Status 03/22/2015 FINAL  Final  Blood Culture (routine x 2)     Status: None   Collection Time: 03/17/15  1:43 PM  Result Value Ref Range Status   Specimen Description BLOOD RIGHT HAND  Final   Special Requests BOTTLES DRAWN AEROBIC ONLY 10CC ROCEPHIN,ZITHROMAX  Final   Culture NO GROWTH 5 DAYS  Final   Report Status 03/22/2015 FINAL  Final  MRSA PCR Screening     Status: None   Collection Time: 03/17/15  4:27 PM  Result Value Ref Range Status   MRSA by PCR NEGATIVE NEGATIVE Final    Comment:        The GeneXpert MRSA Assay (FDA approved for NASAL specimens only), is one component of a comprehensive MRSA colonization surveillance program. It is not intended to diagnose MRSA infection nor to guide or monitor treatment for MRSA infections.   Culture, sputum-assessment     Status: None   Collection Time: 03/19/15  9:01 PM  Result Value Ref Range Status   Specimen Description SPUTUM  Final   Special Requests NONE  Final   Sputum evaluation   Final    THIS SPECIMEN IS ACCEPTABLE. RESPIRATORY CULTURE REPORT TO FOLLOW.   Report Status 03/19/2015 FINAL  Final  Culture, respiratory (NON-Expectorated)     Status: None   Collection Time: 03/19/15  9:01 PM  Result Value Ref Range Status   Specimen Description SPUTUM  Final    Special Requests NONE  Final   Gram Stain   Final    ABUNDANT WBC PRESENT,BOTH PMN AND MONONUCLEAR RARE SQUAMOUS EPITHELIAL CELLS PRESENT FEW YEAST RARE GRAM POSITIVE COCCI IN PAIRS THIS SPECIMEN IS ACCEPTABLE FOR SPUTUM CULTURE    Culture   Final    FEW YEAST CONSISTENT WITH CANDIDA SPECIES Performed at [facility]    Report Status 03/22/2015 FINAL  Final     Studies:   Recent x-ray studies  have been reviewed in detail by the Attending Physician  Scheduled Meds:  Scheduled Meds: . ALPRAZolam  1 mg Oral Daily  . antiseptic oral rinse  7 mL Mouth Rinse q12n4p  . azithromycin  500 mg Oral q1800  . Buprenorphine HCl-Naloxone HCl  0.5-1 each Sublingual BID  . cefTRIAXone (ROCEPHIN)  IV  1 g Intravenous Q24H  . chlorhexidine  15 mL Mouth Rinse BID  . DULoxetine  60 mg Oral Daily  . enoxaparin (LOVENOX) injection  40 mg Subcutaneous Q24H  . furosemide  40 mg Intravenous BID  . guaiFENesin  1,200 mg Oral BID  . oseltamivir  75 mg Oral BID  . potassium chloride  20 mEq Oral BID  . sodium chloride  3 mL Intravenous Q12H    Time spent on care of this patient: 35 mins   Jemery Stacey T , MD   [facility] Office  [phone] Pager - Text Page per Loretha Stapler as per below:  On-Call/Text Page:      [email or web address]      password [email or web address]  If 7PM-7AM, please contact night-coverage [email or web address] Password [email or web address] 03/22/2015, 2:45 PM   LOS: 5 days

## 2015-03-23 ENCOUNTER — Inpatient Hospital Stay (HOSPITAL_COMMUNITY): Payer: BLUE CROSS/BLUE SHIELD

## 2015-03-23 LAB — COMPREHENSIVE METABOLIC PANEL
ALBUMIN: 2.6 g/dL — AB (ref 3.5–5.0)
ALK PHOS: 101 U/L (ref 38–126)
ALT: 122 U/L — ABNORMAL HIGH (ref 17–63)
ANION GAP: 7 (ref 5–15)
AST: 113 U/L — ABNORMAL HIGH (ref 15–41)
BUN: 14 mg/dL (ref 6–20)
CALCIUM: 9.1 mg/dL (ref 8.9–10.3)
CHLORIDE: 101 mmol/L (ref 101–111)
CO2: 33 mmol/L — AB (ref 22–32)
Creatinine, Ser: 0.74 mg/dL (ref 0.61–1.24)
GFR calc non Af Amer: >60 mL/min (ref >60)
GLUCOSE: 106 mg/dL — AB (ref 65–99)
POTASSIUM: 4.4 mmol/L (ref 3.5–5.1)
SODIUM: 141 mmol/L (ref 135–145)
Total Bilirubin: 0.8 mg/dL (ref 0.3–1.2)
Total Protein: 6.4 g/dL — ABNORMAL LOW (ref 6.5–8.1)

## 2015-03-23 NOTE — Progress Notes (Signed)
Patient ID: Luke [name], male   DOB: [birth year], 34 y.o.   MRN: [id number]    Progress note reviewed by Dr Katrinka BlazingSmith from 03/22/15, please refer to his recommendations. May contact cardiology service over the weekend if any questions.   Dominga FerryJ Volney Reierson MD

## 2015-03-23 NOTE — Progress Notes (Signed)
[place] TEAM 1 - Stepdown/ICU TEAM PROGRESS NOTE  Luke [name] ZOX:[id number] DOB: Sep 04, 1980 DOA: 03/17/2015 PCP: Lavell IslamLOWARD,DAVIS L, MD  Admit HPI / Brief Narrative: 34 y.o. Male with anxiety disorder, chronic pain, and a history of narcotic addiction who presented to the ED from [facility] with sepsis and bilateral pneumonia/pneumonitis. He started feeling poorly with myalgias, then developed a fever, and later began vomiting and was disoriented. [facility] obtained an x-ray that demonstrated bilateral pneumonia. He was sent to the emergency department for admission.   Significant Events: 12/4 - Admit to Hospital to [facility]. CXR PA/LAT 12/4: Patchy opacification RLL, LLL, Lingula, & LUL. No pleural effusion. Heart normal in size. Mediastinum normal in contour. 12/5 - Transfer to ICU for increase O2 needs / accessory muscle use. pCXR 12/5>> progressed bilateral lower lung PNA w/ sm bilat pleural effusions 12/6 - TTE EF 45%  HPI/Subjective: The patient continues to feel better.  He is highly motivated to be discharged home.  He denies chest pain fevers chills nausea or vomiting.  With ambulation today he did desaturate to 87% on RA.    Assessment/Plan:  Severe CAP - Multifocal pneumonia - Acute Hypoxic Respiratory Failure ?viral etiology per Pulm - Tamiflu for 5 days total (end date 12/9) - CT chest w/o surprising findings but remarkable for nodular character of infiltrate - will need f/u CT as outpt after resolution of illness to assure this nodularity has resolved - attempt to wean O2 as rapidly as able - cont gentle diuresis again today   Tachycardia suspect reactive - resolved   Mildly reduced EF TTE 12/6 shows EF 45-50% - Cardiology consulted - no evidence of myocarditis - to have f/u TTE in ~1 week if any concerning sx persist   Lactic Acidosis resolved   Anemia - Mild No signs of active bleeding - likely due to acute critical illness - Hgb slowly improving    Mild Hyperglycemia  A1c pending   Acute Encephalopathy Suspect related to hypoxia - resolved  Mild transaminitis No clear etiology - recheck in AM   Hx of narcotic dependence  UDS positive for Benzos, but pt prescribed benzos   Code Status: FULL Family Communication: spoke w/ pt and mother at bedside   Disposition Plan: wean O2 - ambulate - hopeful for d/c in AM  [facility]   Antibiotics: Azithromycin 12/4 > 12/8 Ceftriaxone 12/4 >`12/9  DVT prophylaxis: lovenox   Objective: Blood pressure 114/58, pulse 51, temperature 98.1 F (36.7 C), temperature source Oral, resp. rate 16, height 5\' 9"  (1.753 m), weight 71.532 kg (157 lb 11.2 oz), SpO2 95 %.  Intake/Output Summary (Last 24 hours) at 03/23/15 1412 Last data filed at 03/23/15 0600  Gross per 24 hour  Intake    580 ml  Output   1600 ml  Net  -1020 ml   Exam: General: No acute respiratory distress at rest - alert and oriented  Lungs: fine crackles scattered th/o - no wheeze  Cardiovascular: Regular rate and rhythm without murmur gallop or rub Abdomen: Nontender, nondistended, soft, bowel sounds positive, no rebound, no ascites, no appreciable mass Extremities: No significant cyanosis, clubbing, edema bilateral lower extremities  Data Reviewed: Basic Metabolic Panel:  Recent Labs Lab 03/18/15 0458 03/19/15 1025 03/20/15 0234 03/22/15 0340 03/23/15 0534  NA 135 137 138 141 141  K 3.7 4.1 3.8 4.2 4.4  CL 105 103 106 105 101  CO2 25 28 27 31  33*  GLUCOSE 135* 123*  113* 111* 106*  BUN CREATININE 0.80 0.72 0.71 0.72 0.74  CALCIUM 7.5* 8.0* 7.6* 8.4* 9.1  MG  --  2.0 1.9 1.9  --   PHOS  --  1.5* 2.4* 5.0*  --     CBC:  Recent Labs Lab 03/17/15 1134 03/18/15 0458 03/19/15 1025 03/20/15 0234 03/22/15 0340  WBC 9.8 7.6 6.7 5.6 5.8  NEUTROABS 8.8*  --  5.7 3.8 3.4  HGB 14.0 11.9* 11.2* 10.6* 11.2*  HCT 41.1 34.0* 33.8* 32.4* 32.0*  MCV 98.1 97.7 99.7 99.1  97.9  PLT 178 156 175 190 258    Liver Function Tests:  Recent Labs Lab 03/17/15 1354 03/19/15 1025 03/20/15 0234 03/23/15 0534  AST 33  --   --  113*  ALT 41  --   --  122*  ALKPHOS 55  --   --  101  BILITOT 0.6  --   --  0.8  PROT 5.7*  --   --  6.4*  ALBUMIN 2.7* 2.3* 2.1* 2.6*   Coags:  Recent Labs Lab 03/17/15 1354  INR 1.32    Recent Labs Lab 03/17/15 1354  APTT 38*    Cardiac Enzymes:  Recent Labs Lab 03/21/15 1700 03/21/15 2307 03/22/15 0340  TROPONINI <0.03 <0.03 <0.03    Recent Results (from the past 240 hour(s))  Blood Culture (routine x 2)     Status: None   Collection Time: 03/17/15  1:37 PM  Result Value Ref Range Status   Specimen Description BLOOD RIGHT ANTECUBITAL  Final   Special Requests   Final    BOTTLES DRAWN AEROBIC AND ANAEROBIC 10CC ROCEPHIN,ZITHROMAX   Culture NO GROWTH 5 DAYS  Final   Report Status 03/22/2015 FINAL  Final  Blood Culture (routine x 2)     Status: None   Collection Time: 03/17/15  1:43 PM  Result Value Ref Range Status   Specimen Description BLOOD RIGHT HAND  Final   Special Requests BOTTLES DRAWN AEROBIC ONLY 10CC ROCEPHIN,ZITHROMAX  Final   Culture NO GROWTH 5 DAYS  Final   Report Status 03/22/2015 FINAL  Final  MRSA PCR Screening     Status: None   Collection Time: 03/17/15  4:27 PM  Result Value Ref Range Status   MRSA by PCR NEGATIVE NEGATIVE Final    Comment:        The GeneXpert MRSA Assay (FDA approved for NASAL specimens only), is one component of a comprehensive MRSA colonization surveillance program. It is not intended to diagnose MRSA infection nor to guide or monitor treatment for MRSA infections.   Culture, sputum-assessment     Status: None   Collection Time: 03/19/15  9:01 PM  Result Value Ref Range Status   Specimen Description SPUTUM  Final   Special Requests NONE  Final   Sputum evaluation   Final    THIS SPECIMEN IS ACCEPTABLE. RESPIRATORY CULTURE REPORT TO FOLLOW.   Report  Status 03/19/2015 FINAL  Final  Culture, respiratory (NON-Expectorated)     Status: None   Collection Time: 03/19/15  9:01 PM  Result Value Ref Range Status   Specimen Description SPUTUM  Final   Special Requests NONE  Final   Gram Stain   Final    ABUNDANT WBC PRESENT,BOTH PMN AND MONONUCLEAR RARE SQUAMOUS EPITHELIAL CELLS PRESENT FEW YEAST RARE GRAM POSITIVE COCCI IN PAIRS THIS SPECIMEN IS ACCEPTABLE FOR SPUTUM CULTURE    Culture   Final    FEW  YEAST CONSISTENT WITH CANDIDA SPECIES Performed at [facility]    Report Status 03/22/2015 FINAL  Final     Studies:   Recent x-ray studies have been reviewed in detail by the Attending Physician  Scheduled Meds:  Scheduled Meds: . ALPRAZolam  1 mg Oral Daily  . antiseptic oral rinse  7 mL Mouth Rinse q12n4p  . azithromycin  500 mg Oral q1800  . Buprenorphine HCl-Naloxone HCl  0.5-1 each Sublingual BID  . cefTRIAXone (ROCEPHIN)  IV  1 g Intravenous Q24H  . chlorhexidine  15 mL Mouth Rinse BID  . DULoxetine  60 mg Oral Daily  . enoxaparin (LOVENOX) injection  40 mg Subcutaneous Q24H  . furosemide  40 mg Intravenous BID  . guaiFENesin  1,200 mg Oral BID  . potassium chloride  20 mEq Oral BID  . sodium chloride  3 mL Intravenous Q12H    Time spent on care of this patient: 35 mins   MCCLUNG,JEFFREY T , MD   [facility] Office  [phone] Pager - Text Page per Loretha Stapler as per below:  On-Call/Text Page:      [email or web address]      password [email or web address]  If 7PM-7AM, please contact night-coverage [email or web address] Password [email or web address] 03/23/2015, 2:12 PM   LOS: 6 days

## 2015-03-23 NOTE — Progress Notes (Signed)
SATURATION QUALIFICATIONS: (This note is used to comply with regulatory documentation for home oxygen)  Patient Saturations on Room Air at Rest = 89% Heart rate at rest = 109  Patient Saturations on Room Air while Ambulating = 87% Heart rate while ambulating on room air = 99  Patient Saturations on 2 Liters of oxygen while Ambulating = 94% Heart rate while ambulating on 2L of O2 = 109  Please briefly explain why patient needs home oxygen: to improve O2 saturation  Peri MarisAndrew Kalix Meinecke, MBA, BS, RN

## 2015-03-24 DIAGNOSIS — R740 Nonspecific elevation of levels of transaminase and lactic acid dehydrogenase [LDH]: Secondary | ICD-10-CM

## 2015-03-24 LAB — COMPREHENSIVE METABOLIC PANEL
ALBUMIN: 2.9 g/dL — AB (ref 3.5–5.0)
ALT: 162 U/L — AB (ref 17–63)
AST: 156 U/L — AB (ref 15–41)
Alkaline Phosphatase: 110 U/L (ref 38–126)
Anion gap: 7 (ref 5–15)
BILIRUBIN TOTAL: 0.8 mg/dL (ref 0.3–1.2)
BUN: 26 mg/dL — AB (ref 6–20)
CHLORIDE: 101 mmol/L (ref 101–111)
CO2: 32 mmol/L (ref 22–32)
CREATININE: 0.76 mg/dL (ref 0.61–1.24)
Calcium: 9.5 mg/dL (ref 8.9–10.3)
GFR calc Af Amer: >60 mL/min (ref >60)
GLUCOSE: 106 mg/dL — AB (ref 65–99)
POTASSIUM: 4.5 mmol/L (ref 3.5–5.1)
Sodium: 140 mmol/L (ref 135–145)
Total Protein: 6.7 g/dL (ref 6.5–8.1)

## 2015-03-24 NOTE — Progress Notes (Signed)
SATURATION QUALIFICATIONS: (This note is used to comply with regulatory documentation for home oxygen)  Patient Saturations on Room Air at Rest = 95%  Patient Saturations on Room Air while Ambulating = 95%  Please briefly explain why patient needs home oxygen: N/A  Peri MarisAndrew Genesis Novosad, MBA, BS, RN

## 2015-03-24 NOTE — Progress Notes (Signed)
Patient discharge teaching given, including activity, diet, follow-up appoints, and medications. Patient verbalized understanding of all discharge instructions. IV access was d/c'd. Vitals are stable. Skin is intact except as charted in most recent assessments. Pt to be escorted out by NT, to be driven home by family.  Kia Varnadore, MBA, BS, RN 

## 2015-03-24 NOTE — Discharge Instructions (Signed)
Pneumonitis  Pneumonitis is inflammation of the lungs.   CAUSES   Many things can cause pneumonitis. These can include:    A bacterial or viral infection. Pneumonitis due to an infection is usually called pneumonia.   Work-related exposures, including farm and industrial work. Some substances that can cause pneumonitis include asbestos, silica, inhaled acids, or inhaled chlorine gas.    Repeated exposure to bird feathers, bird feces, or other allergens.    Medicine such as chemotherapy drugs, certain antibiotics, and some heart medicines.    Radiation therapy.    Exposure to mold. A hot tub, sauna, or home humidifier can have mold growing in it, even if it looks clean. The mold can be breathed in through water vapor.   Breathing (aspirating) stomach contents, food, or liquids into the lungs.   SIGNS AND SYMPTOMS    Cough.    Shortness of breath or difficulty breathing.    Fever.    Decreased energy.    Decreased appetite.   DIAGNOSIS   To diagnose pneumonitis, your health care provider will do a complete history and physical exam. Various tests may be ordered, such as:    Pulmonary function test.    Chest X-ray.    CT scan of the lungs.    Bronchoscopy.    Lung biopsy.   TREATMENT   Treatment will depend on the cause of the pneumonitis. If the cause is exposure to a substance, avoiding further exposure to that substance will help reduce your symptoms. Possible medical treatments for pneumonitis include:    Corticosteroid medicine to help decrease inflammation in the lungs.    Antibiotic medicine to help fight a bacterial lung infection.    Oxygen therapy if you are having difficulty breathing.   HOME CARE INSTRUCTIONS    Avoid exposure to any substance identified as the cause of your pneumonitis.    If you must continue to work with substances that can cause pneumonitis, wear a mask to protect your lungs.    Only take over-the-counter or prescription medicine as directed by  your health care provider.    Do not smoke.    If you use inhalers, keep them with you at all times.    Follow up with your health care provider as directed.   SEEK IMMEDIATE MEDICAL CARE IF:    You develop new or increased shortness of breath.    You develop a blue color (cyanosis) under your fingernails.    You have a fever.   MAKE SURE YOU:    Understand these instructions.   Will watch your condition.   Will get help right away if you are not doing well or get worse.     This information is not intended to replace advice given to you by your health care provider. Make sure you discuss any questions you have with your health care provider.     Document Released: 09/17/2009 Document Revised: 11/30/2012 Document Reviewed: 09/19/2012  [employer] Interactive Patient Education 2016 [employer]

## 2015-03-24 NOTE — Discharge Summary (Signed)
DISCHARGE SUMMARY  Luke [name]  MR#: [id number]  DOB:1981-01-28  Date of Admission: 03/17/2015 Date of Discharge: 03/24/2015  Attending Physician:MCCLUNG,JEFFREY T  Patient's NGE:XBMWUXL,KGMWN L, MD  Consults:   P[facility]   Disposition: D/C home   Follow-up Appts: Follow-up Information    Follow up with CLOWARD,DAVIS L, MD In 5 days.   Specialty:  Internal Medicine   Why:  You will need a check of your liver function in 4-5 days as we discussed.  This is very important.     Contact information:   [address] [place] [address] [phone]       Follow up with [facility], MD.   Specialty:  Pulmonary Disease   Why:  The office will call you to arrange a follow up appointmnt - if you do not hear from the office by 03/27/15 give them a call to arrange an appointment in 3-4 weeks.     Contact information:   [place] [address] [phone]      Tests Needing Follow-up: -repeat LFTs are suggested in 4-5 days to evaluate idiopathic mild transaminitis at time of d/c -f/u of A1c is suggested - results were pending at the time of his d/c  -CT chest is suggested in 4-6 weeks to f/u the nodularity noted on his inpatient CT -consider a f/u TTE if the pt has any sx to suggest CHF   Discharge Diagnoses: Severe CAP - Multifocal pneumonia - Acute Hypoxic Respiratory Failure Tachycardia Mildly reduced EF Lactic Acidosis Anemia - Mild Mild Hyperglycemia  Acute Encephalopathy Mild transaminitis Hx of narcotic dependence   Initial presentation: 34 y.o. Male with anxiety disorder, chronic pain, and a history of narcotic addiction who presented to the ED from [facility] with sepsis and bilateral pneumonia/pneumonitis. He started feeling poorly with myalgias, then developed a fever, and later began vomiting and was disoriented. [facility] obtained an x-ray that demonstrated bilateral pneumonia. He was sent to the emergency  department for admission.   Significant Events: 12/4 - Admit to [facility] to [facility]. CXR PA/LAT 12/4: Patchy opacification RLL, LLL, Lingula, & LUL. No pleural effusion. Heart normal in size. Mediastinum normal in contour. 12/5 - Transfer to ICU for increase O2 needs / accessory muscle use. pCXR 12/5>> progressed bilateral lower lung PNA w/ sm bilat pleural effusions 12/6 - TTE EF 45%  Hospital Course:  Severe CAP - Multifocal pneumonia - Acute Hypoxic Respiratory Failure ?viral etiology per Pulm - Tamiflu for 5 days total (end date 12/9) - CT chest w/o surprising findings but remarkable for nodular character of infiltrate - will need f/u CT as outpt after resolution of illness to assure this nodularity has resolved - w/ gentle diuresis was able to wean to RA w/ sats 95% even on ambulation prior to d/c    Tachycardia suspect reactive - resolved   Mildly reduced EF TTE 12/6 noted EF 45-50% - Cardiology consulted - no evidence of myocarditis - to have f/u TTE in ~1if any concerning sx persist, but it is suspected that mild impairment of fxn was a transient part of his severe acute pulm disease   Lactic Acidosis resolved   Anemia - Mild No signs of active bleeding - likely due to acute critical illness - Hgb slowly improving   Mild Hyperglycemia  A1c still pending at time of d/c   Acute Encephalopathy Suspect related to hypoxia - resolved  Mild transaminitis No clear etiology - discussed at length w/ pt  and his wife at bedside prior to d/c - ?due to lasix dosing during hospital stay (last dose of lasix given 12/11 AM) - Tbil and alk phos normal - educated pt on absolute need for f/u in 4-5 days - he and wife state they will be able to arrange this through his PCP w/o difficulty - if persists, viral hepatitis panel, and complete evaluation will be indicated   Hx of narcotic dependence  UDS positive for Benzos, but pt prescribed benzos - no drug seeking behavior observed during this  admit     Medication List    TAKE these medications        ALPRAZolam 1 MG tablet  Commonly known as:  XANAX  Take 1 mg by mouth daily.     DULoxetine 60 MG capsule  Commonly known as:  CYMBALTA  Take 60 mg by mouth daily.     ibuprofen 200 MG tablet  Commonly known as:  ADVIL,MOTRIN  Take 400 mg by mouth every 6 (six) hours as needed for fever (pain).     ZUBSOLV 5.7-1.4 MG Subl  Generic drug:  Buprenorphine HCl-Naloxone HCl  Place 0.5-1 each under the tongue 2 (two) times daily.        Day of Discharge BP 117/69 mmHg  Pulse 57  Temp(Src) 97.8 F (36.6 C) (Oral)  Resp 18  Ht 5' 9" (1.753 m)  Wt 71.3 kg (157 lb 3 oz)  BMI 23.20 kg/m2  SpO2 95%  Physical Exam: General: No acute respiratory distress Lungs: Clear to auscultation bilaterally without wheezes or crackles Cardiovascular: Regular rate and rhythm without murmur gallop or rub normal S1 and S2 Abdomen: Nontender, nondistended, soft, bowel sounds positive, no rebound, no ascites, no appreciable mass Extremities: No significant cyanosis, clubbing, or edema bilateral lower extremities  Basic Metabolic Panel:  Recent Labs Lab 03/19/15 1025 03/20/15 0234 03/22/15 0340 03/23/15 0534 03/24/15 0531  NA 137 138 141 141 140  K 4.1 3.8 4.2 4.4 4.5  CL 103 106 105 101 101  CO2 _0 33* 32  GLUCOSE 123* 113* 111* 106* 106*  BUN _1 26*  CREATININE 0.72 0.71 0.72 0.74 0.76  CALCIUM 8.0* 7.6* 8.4* 9.1 9.5  MG 2.0 1.9 1.9  --   --   PHOS 1.5* 2.4* 5.0*  --   --     Liver Function Tests:  Recent Labs Lab 03/17/15 1354 03/19/15 1025 03/20/15 0234 03/23/15 0534 03/24/15 0531  AST 33  --   --  113* 156*  ALT 41  --   --  122* 162*  ALKPHOS 55  --   --  101 110  BILITOT 0.6  --   --  0.8 0.8  PROT 5.7*  --   --  6.4* 6.7  ALBUMIN 2.7* 2.3* 2.1* 2.6* 2.9*    Coags:  Recent Labs Lab 03/17/15 1354  INR 1.32   CBC:  Recent Labs Lab 03/17/15 1134 03/18/15 0458 03/19/15 1025  03/20/15 0234 03/22/15 0340  WBC 9.8 7.6 6.7 5.6 5.8  NEUTROABS 8.8*  --  5.7 3.8 3.4  HGB 14.0 11.9* 11.2* 10.6* 11.2*  HCT 41.1 34.0* 33.8* 32.4* 32.0*  MCV 98.1 97.7 99.7 99.1 97.9  PLT 178 156 175 190 258    Cardiac Enzymes:  Recent Labs Lab 03/21/15 1700 03/21/15 2307 03/22/15 0340  TROPONINI <0.03 <0.03 <0.03   BNP (last 3 results)  Recent Labs  03/20/15 1200  BNP 267.8*    Recent  Results (from the past 240 hour(s))  Blood Culture (routine x 2)     Status: None   Collection Time: 03/17/15  1:37 PM  Result Value Ref Range Status   Specimen Description BLOOD RIGHT ANTECUBITAL  Final   Special Requests   Final    BOTTLES DRAWN AEROBIC AND ANAEROBIC 10CC ROCEPHIN,ZITHROMAX   Culture NO GROWTH 5 DAYS  Final   Report Status 03/22/2015 FINAL  Final  Blood Culture (routine x 2)     Status: None   Collection Time: 03/17/15  1:43 PM  Result Value Ref Range Status   Specimen Description BLOOD RIGHT HAND  Final   Special Requests BOTTLES DRAWN AEROBIC ONLY 10CC ROCEPHIN,ZITHROMAX  Final   Culture NO GROWTH 5 DAYS  Final   Report Status 03/22/2015 FINAL  Final  MRSA PCR Screening     Status: None   Collection Time: 03/17/15  4:27 PM  Result Value Ref Range Status   MRSA by PCR NEGATIVE NEGATIVE Final    Comment:        The GeneXpert MRSA Assay (FDA approved for NASAL specimens only), is one component of a comprehensive MRSA colonization surveillance program. It is not intended to diagnose MRSA infection nor to guide or monitor treatment for MRSA infections.   Culture, sputum-assessment     Status: None   Collection Time: 03/19/15  9:01 PM  Result Value Ref Range Status   Specimen Description SPUTUM  Final   Special Requests NONE  Final   Sputum evaluation   Final    THIS SPECIMEN IS ACCEPTABLE. RESPIRATORY CULTURE REPORT TO FOLLOW.   Report Status 03/19/2015 FINAL  Final  Culture, respiratory (NON-Expectorated)     Status: None   Collection Time: 03/19/15   9:01 PM  Result Value Ref Range Status   Specimen Description SPUTUM  Final   Special Requests NONE  Final   Gram Stain   Final    ABUNDANT WBC PRESENT,BOTH PMN AND MONONUCLEAR RARE SQUAMOUS EPITHELIAL CELLS PRESENT FEW YEAST RARE GRAM POSITIVE COCCI IN PAIRS THIS SPECIMEN IS ACCEPTABLE FOR SPUTUM CULTURE    Culture   Final    FEW YEAST CONSISTENT WITH CANDIDA SPECIES Performed at [facility]    Report Status 03/22/2015 FINAL  Final     Time spent in discharge (includes decision making & examination of pt): >35 minutes  03/24/2015, 11:19 AM   Cherene Altes, MD [facility] Office  [phone] Pager [phone]  On-Call/Text Page:      [email or web address]      password [address] [facility]

## 2015-03-25 LAB — HEMOGLOBIN A1C
HEMOGLOBIN A1C: 5.6 % (ref 4.8–5.6)
Mean Plasma Glucose: 114 mg/dL

## 2015-03-25 NOTE — Telephone Encounter (Signed)
Spoke with pt and given appt with TP on 04/19/15 at 4:00.

## 2015-03-26 LAB — RESPIRATORY VIRUS PANEL
ADENOVIRUS: NEGATIVE
INFLUENZA B 1: NEGATIVE
Influenza A: NEGATIVE
METAPNEUMOVIRUS: NEGATIVE
PARAINFLUENZA 1 A: NEGATIVE
Parainfluenza 2: NEGATIVE
Parainfluenza 3: NEGATIVE
Respiratory Syncytial Virus A: NEGATIVE
Respiratory Syncytial Virus B: NEGATIVE
Rhinovirus: NEGATIVE

## 2015-04-19 ENCOUNTER — Inpatient Hospital Stay: Payer: BLUE CROSS/BLUE SHIELD | Admitting: Adult Health

## 2016-01-20 ENCOUNTER — Ambulatory Visit (INDEPENDENT_AMBULATORY_CARE_PROVIDER_SITE_OTHER): Payer: BLUE CROSS/BLUE SHIELD | Admitting: Family Medicine

## 2016-01-20 VITALS — BP 122/72 | HR 76 | Temp 98.1°F | Resp 17 | Ht 69.5 in | Wt 162.0 lb

## 2016-01-20 DIAGNOSIS — M791 Myalgia, unspecified site: Secondary | ICD-10-CM

## 2016-01-20 DIAGNOSIS — R51 Headache: Secondary | ICD-10-CM | POA: Diagnosis not present

## 2016-01-20 DIAGNOSIS — R509 Fever, unspecified: Secondary | ICD-10-CM

## 2016-01-20 DIAGNOSIS — R519 Headache, unspecified: Secondary | ICD-10-CM

## 2016-01-20 DIAGNOSIS — J029 Acute pharyngitis, unspecified: Secondary | ICD-10-CM

## 2016-01-20 LAB — POCT INFLUENZA A/B
INFLUENZA B, POC: NEGATIVE
Influenza A, POC: NEGATIVE

## 2016-01-20 LAB — POCT RAPID STREP A (OFFICE): Rapid Strep A Screen: NEGATIVE

## 2016-01-20 MED ORDER — AMOXICILLIN-POT CLAVULANATE 875-125 MG PO TABS: 1.0000 | ORAL_TABLET | Freq: Two times a day (BID) | ORAL | 0 refills | Status: DC

## 2016-01-20 MED ORDER — FLUTICASONE PROPIONATE 50 MCG/ACT NA SUSP: 2.0000 | Freq: Every day | NASAL | 6 refills | Status: AC

## 2016-01-20 NOTE — Patient Instructions (Addendum)
   IF you received an x-ray today, you will receive an invoice from [facility]. Please contact  Radiology at [phone] with questions or concerns regarding your invoice.   IF you received labwork today, you will receive an invoice from [facility]. Please contact [facility] at [phone] with questions or concerns regarding your invoice.   Our billing staff will not be able to assist you with questions regarding bills from these companies.  You will be contacted with the lab results as soon as they are available. The fastest way to get your results is to activate your My Chart account. Instructions are located on the last page of this paperwork. If you have not heard from us regarding the results in 2 weeks, please contact this office.     Sinusitis, Adult Sinusitis is redness, soreness, and inflammation of the paranasal sinuses. Paranasal sinuses are air pockets within the bones of your face. They are located beneath your eyes, in the middle of your forehead, and above your eyes. In healthy paranasal sinuses, mucus is able to drain out, and air is able to circulate through them by way of your nose. However, when your paranasal sinuses are inflamed, mucus and air can become trapped. This can allow bacteria and other germs to grow and cause infection. Sinusitis can develop quickly and last only a short time (acute) or continue over a long period (chronic). Sinusitis that lasts for more than 12 weeks is considered chronic. CAUSES Causes of sinusitis include:  Allergies.  Structural abnormalities, such as displacement of the cartilage that separates your nostrils (deviated septum), which can decrease the air flow through your nose and sinuses and affect sinus drainage.  Functional abnormalities, such as when the small hairs (cilia) that line your sinuses and help remove mucus do not work properly or are not present. SIGNS AND SYMPTOMS Symptoms  of acute and chronic sinusitis are the same. The primary symptoms are pain and pressure around the affected sinuses. Other symptoms include:  Upper toothache.  Earache.  Headache.  Bad breath.  Decreased sense of smell and taste.  A cough, which worsens when you are lying flat.  Fatigue.  Fever.  Thick drainage from your nose, which often is green and may contain pus (purulent).  Swelling and warmth over the affected sinuses. DIAGNOSIS Your health care provider will perform a physical exam. During your exam, your health care provider may perform any of the following to help determine if you have acute sinusitis or chronic sinusitis:  Look in your nose for signs of abnormal growths in your nostrils (nasal polyps).  Tap over the affected sinus to check for signs of infection.  View the inside of your sinuses using an imaging device that has a light attached (endoscope). If your health care provider suspects that you have chronic sinusitis, one or more of the following tests may be recommended:  Allergy tests.  Nasal culture. A sample of mucus is taken from your nose, sent to a lab, and screened for bacteria.  Nasal cytology. A sample of mucus is taken from your nose and examined by your health care provider to determine if your sinusitis is related to an allergy. TREATMENT Most cases of acute sinusitis are related to a viral infection and will resolve on their own within 10 days. Sometimes, medicines are prescribed to help relieve symptoms of both acute and chronic sinusitis. These may include pain medicines, decongestants, nasal steroid sprays, or saline sprays. However, for sinusitis related   to a bacterial infection, your health care provider will prescribe antibiotic medicines. These are medicines that will help kill the bacteria causing the infection. Rarely, sinusitis is caused by a fungal infection. In these cases, your health care provider will prescribe antifungal  medicine. For some cases of chronic sinusitis, surgery is needed. Generally, these are cases in which sinusitis recurs more than 3 times per year, despite other treatments. HOME CARE INSTRUCTIONS  Drink plenty of water. Water helps thin the mucus so your sinuses can drain more easily.  Use a humidifier.  Inhale steam 3-4 times a day (for example, sit in the bathroom with the shower running).  Apply a warm, moist washcloth to your face 3-4 times a day, or as directed by your health care provider.  Use saline nasal sprays to help moisten and clean your sinuses.  Take medicines only as directed by your health care provider.  If you were prescribed either an antibiotic or antifungal medicine, finish it all even if you start to feel better. SEEK IMMEDIATE MEDICAL CARE IF:  You have increasing pain or severe headaches.  You have nausea, vomiting, or drowsiness.  You have swelling around your face.  You have vision problems.  You have a stiff neck.  You have difficulty breathing.   This information is not intended to replace advice given to you by your health care provider. Make sure you discuss any questions you have with your health care provider.   Document Released: 03/30/2005 Document Revised: 04/20/2014 Document Reviewed: 04/14/2011 Elsevier Interactive Patient Education 2016 Elsevier Inc.  

## 2016-01-20 NOTE — Progress Notes (Signed)
Chief Complaint  Patient presents with  . Sore Throat  . Fatigue  . Headache    HPI  Upper Respiratory Infection- new problem requiring workup: Patient complains of symptoms of a URI. Symptoms include congestion, fever, sore throat, swollen glands and frontal headache. Onset of symptoms was 2 days ago, rapidly worsening since that time. He also c/o facial pain and fever 101 on Saturday.  He is not drinking much. Evaluation to date: none. Treatment to date: none.  Non smoker No history of asthma   Denies history of recent sinus infection   Past Medical History:  Diagnosis Date  . Anxiety    sees Dr. Milagros Evenerupinder Kaur   . Chronic knee pain    sees [facility] ([facility]one clinic)  . History of narcotic addiction (HCC)   . Neck pain   . Numbness on right side     Current Outpatient Prescriptions  Medication Sig Dispense Refill  . ALPRAZolam (XANAX) 1 MG tablet Take 1 mg by mouth daily.     . Buprenorphine HCl-Naloxone HCl (ZUBSOLV) 5.7-1.4 MG SUBL Place 0.5-1 each under the tongue 2 (two) times daily.     . DULoxetine (CYMBALTA) 60 MG capsule Take 60 mg by mouth daily.    Marland Kitchen. ibuprofen (ADVIL,MOTRIN) 200 MG tablet Take 400 mg by mouth every 6 (six) hours as needed for fever (pain).    Marland Kitchen. amoxicillin-clavulanate (AUGMENTIN) 875-125 MG tablet Take 1 tablet by mouth 2 (two) times daily. 20 tablet 0   No current facility-administered medications for this visit.    No Known Allergies   Past Surgical History:  Procedure Laterality Date  . INNER EAR SURGERY    . KNEE SURGERY  1999 and 2000   per Dr. Richardson Landryan Murphy     Social History   Social History  . Marital status: Single    Spouse name: N/A  . Number of children: 4  . Years of education: Some colle   Occupational History  . Store Production designer, theatre/television/filmManager    Social History Main Topics  . Smoking status: Never Smoker  . Smokeless tobacco: Never Used  . Alcohol use 0.0 oz/week     Comment: Social  . Drug use: No  . Sexual activity:  Not Asked   Other Topics Concern  . None   Social History Narrative   Patient is married with 4 children. [place]. No mold or pet exposure. No tuberculosis exposure. No recent travel. Traveled to [place] 2 years ago.    ROS See hpi  Objective: Vitals:   01/20/16 1038  BP: 122/72  Pulse: 76  Resp: 17  Temp: 98.1 F (36.7 C)  TempSrc: Oral  SpO2: 99%  Weight: 162 lb (73.5 kg)  Height: 5' 9.5" (1.765 m)    Physical Exam  Constitutional: He is oriented to person, place, and time. He appears well-developed and well-nourished.  HENT:  Head: Normocephalic and atraumatic.  Right Ear: External ear normal.  Left Ear: External ear normal.  Mouth/Throat: Oropharyngeal exudate present.  Bilateral frontal sinus tenderness  Eyes: Conjunctivae and EOM are normal.  Cardiovascular: Normal rate, regular rhythm and normal heart sounds.   No murmur heard. Pulmonary/Chest: Effort normal and breath sounds normal. No respiratory distress. He has no wheezes.  Neurological: He is alert and oriented to person, place, and time.     Assessment and Plan Luke [name] was seen today for sore throat, fatigue and headache.  Diagnoses and all orders for this visit:  Fever and chills- likely  viral syndrome -     Rapid Strep A  Acute pharyngitis, unspecified etiology- negative for strep Culture sent per clinic protocol -     Rapid Strep A -     Culture, Group A Strep  Myalgia- flu negative -     Influenza A/B  Severe frontal headaches -  Likely sinusitis  Will treat No need for imaging  -     amoxicillin-clavulanate (AUGMENTIN) 875-125 MG tablet; Take 1 tablet by mouth 2 (two) times daily.     Deberah Adolf A Leslie Langille

## 2016-01-21 ENCOUNTER — Ambulatory Visit (INDEPENDENT_AMBULATORY_CARE_PROVIDER_SITE_OTHER): Payer: BLUE CROSS/BLUE SHIELD | Admitting: Family Medicine

## 2016-01-21 VITALS — BP 122/72 | HR 89 | Temp 97.6°F | Resp 17 | Ht 69.5 in | Wt 166.0 lb

## 2016-01-21 DIAGNOSIS — T7840XA Allergy, unspecified, initial encounter: Secondary | ICD-10-CM

## 2016-01-21 LAB — CULTURE, GROUP A STREP: ORGANISM ID, BACTERIA: NORMAL

## 2016-01-21 MED ORDER — METHYLPREDNISOLONE SODIUM SUCC 125 MG IJ SOLR
125.0000 mg | Freq: Once | INTRAMUSCULAR | Status: AC
  Administered 2016-01-21: 125 mg via INTRAMUSCULAR

## 2016-01-21 MED ORDER — AZITHROMYCIN 250 MG PO TABS: ORAL_TABLET | ORAL | 0 refills | Status: AC

## 2016-01-21 NOTE — Patient Instructions (Signed)
     IF you received an x-ray today, you will receive an invoice from [facility]. Please contact [facility] at [phone] with questions or concerns regarding your invoice.   IF you received labwork today, you will receive an invoice from [facility]. Please contact [facility] at [phone] with questions or concerns regarding your invoice.   Our billing staff will not be able to assist you with questions regarding bills from these companies.  You will be contacted with the lab results as soon as they are available. The fastest way to get your results is to activate your My Chart account. Instructions are located on the last page of this paperwork. If you have not heard from us regarding the results in 2 weeks, please contact this office.      

## 2016-01-21 NOTE — Progress Notes (Signed)
Chief Complaint  Patient presents with  . allergic reaction    HPI Pt was started on Augmentin for sinusitis. He took one dose overnight and started having itching of his hands and ear on the left  He noticed this morning that he has small bumps on his back that were spreading He denies wheezing, tongue swelling, streaking of the skin or blisters He took [name] benadryl this morning He has tolerated penicillin in the past and had no known allergies prior to this   Past Medical History:  Diagnosis Date  . Anxiety    sees Dr. Milagros [name]   . Chronic knee pain    sees [facility] (methadone clinic)  . History of narcotic addiction (HCC)   . Neck pain   . Numbness on right side     Current Outpatient Prescriptions  Medication Sig Dispense Refill  . ALPRAZolam (XANAX) 1 MG tablet Take 1 mg by mouth daily.     Marland Kitchen amphetamine-dextroamphetamine (ADDERALL XR) 10 MG 24 hr capsule Take 10 mg by mouth daily.    . Buprenorphine HCl-Naloxone HCl (ZUBSOLV) 5.7-1.4 MG SUBL Place 0.5-1 each under the tongue 2 (two) times daily.     . DULoxetine (CYMBALTA) 60 MG capsule Take 60 mg by mouth daily.    . fluticasone (FLONASE) 50 MCG/ACT nasal spray Place 2 sprays into both nostrils daily. 16 g 6  . azithromycin (ZITHROMAX) 250 MG tablet Take 2 tablets today then one tablet on day 2 until complete 6 tablet 0  . ibuprofen (ADVIL,MOTRIN) 200 MG tablet Take 400 mg by mouth every 6 (six) hours as needed for fever (pain).     Current Facility-Administered Medications  Medication Dose Route Frequency Provider Last Rate Last Dose  . methylPREDNISolone sodium succinate (SOLU-MEDROL) 125 mg/2 mL injection 125 mg  125 mg Intramuscular Once Luke Bosworth, MD        Allergies:  Allergies  Allergen Reactions  . Augmentin [Amoxicillin-Pot Clavulanate] Hives and Rash    Past Surgical History:  Procedure Laterality Date  . INNER EAR SURGERY    . KNEE SURGERY  1999 and 2000   per Dr. Richardson [name]      Social History   Social History  . Marital status: Single    Spouse name: N/[name]  . Number of children: 4  . Years of education: Some colle   Occupational History  . 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 Smoking status: Never Smoker  . Smokeless tobacco: Never Used  . Alcohol use 0.0 oz/week     Comment: Social  . Drug use: No  . Sexual activity: Not Asked   Other Topics Concern  . None   Social History Narrative   Patient is married with 4 children. Originally from [place]. No mold or pet exposure. No tuberculosis exposure. No recent travel. Traveled to [place] 2 years ago.    Review of Systems  Constitutional: Negative for chills, diaphoresis, fever and malaise/fatigue.  Eyes: Negative for blurred vision, double vision and discharge.  Neurological: Negative for dizziness and tingling.   See hpi  Objective: Vitals:   01/21/16 0903  BP: 122/72  Pulse: 89  Resp: 17  Temp: 97.6 F (36.4 C)  TempSrc: Oral  SpO2: 98%  Weight: 166 lb (75.3 kg)  Height: 5' 9.5" (1.765 m)    Physical Exam  General: alert, oriented, in NAD Head: normocephalic, atraumatic, no sinus tenderness Eyes: EOM intact, no scleral icterus or conjunctival injection  Ears: TM clear bilaterally Throat: no pharyngeal exudate or erythema, normal tongue size, no spots in mouth Lymph: no posterior auricular, submental or cervical lymph adenopathy Heart: normal rate, normal sinus rhythm, no murmurs Lungs: clear to auscultation bilaterally, no wheezing Skin: flat papular lesions on back, discrete nodules on hands bilaterally (4 each), no vesicles  Assessment and Plan Luke [name] was seen today for allergic reaction.  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 -     azithromycin (ZITHROMAX) 250 MG tablet; Take 2 tablets today then one tablet on day 2 until complete     Luke [name] Luke [name]

## 2019-10-12 ENCOUNTER — Ambulatory Visit: Payer: BLUE CROSS/BLUE SHIELD | Admitting: Allergy & Immunology

## 2021-09-12 ENCOUNTER — Other Ambulatory Visit: Payer: Self-pay

## 2021-09-12 ENCOUNTER — Emergency Department (HOSPITAL_BASED_OUTPATIENT_CLINIC_OR_DEPARTMENT_OTHER)
Admission: EM | Admit: 2021-09-12 | Discharge: 2021-09-12 | Disposition: A | Discharge status: Home / Self Care | Payer: PRIVATE HEALTH INSURANCE | Attending: Emergency Medicine | Admitting: Emergency Medicine

## 2021-09-12 ENCOUNTER — Encounter (HOSPITAL_BASED_OUTPATIENT_CLINIC_OR_DEPARTMENT_OTHER): Payer: Self-pay | Admitting: *Deleted

## 2021-09-12 ENCOUNTER — Emergency Department (HOSPITAL_BASED_OUTPATIENT_CLINIC_OR_DEPARTMENT_OTHER): Payer: PRIVATE HEALTH INSURANCE

## 2021-09-12 DIAGNOSIS — M25521 Pain in right elbow: Secondary | ICD-10-CM | POA: Diagnosis not present

## 2021-09-12 DIAGNOSIS — X58XXXA Exposure to other specified factors, initial encounter: Secondary | ICD-10-CM | POA: Diagnosis not present

## 2021-09-12 DIAGNOSIS — Y990 Civilian activity done for income or pay: Secondary | ICD-10-CM | POA: Diagnosis not present

## 2021-09-12 LAB — BASIC METABOLIC PANEL
Anion gap: 8 (ref 5–15)
BUN: 23 mg/dL — ABNORMAL HIGH (ref 6–20)
CO2: 27 mmol/L (ref 22–32)
Calcium: 9 mg/dL (ref 8.9–10.3)
Chloride: 103 mmol/L (ref 98–111)
Creatinine, Ser: 0.82 mg/dL (ref 0.61–1.24)
GFR, Estimated: >60 mL/min (ref >60)
Glucose, Bld: 109 mg/dL — ABNORMAL HIGH (ref 70–99)
Potassium: 4.1 mmol/L (ref 3.5–5.1)
Sodium: 138 mmol/L (ref 135–145)

## 2021-09-12 LAB — CBC
HCT: 42.2 % (ref 39.0–52.0)
Hemoglobin: 14.1 g/dL (ref 13.0–17.0)
MCH: 33.7 pg (ref 26.0–34.0)
MCHC: 33.4 g/dL (ref 30.0–36.0)
MCV: 101 fL — ABNORMAL HIGH (ref 80.0–100.0)
Platelets: 223 10*3/uL (ref 150–400)
RBC: 4.18 MIL/uL — ABNORMAL LOW (ref 4.22–5.81)
RDW: 12.9 % (ref 11.5–15.5)
WBC: 5.9 10*3/uL (ref 4.0–10.5)
nRBC: 0 % (ref 0.0–0.2)

## 2021-09-12 MED ORDER — IBUPROFEN 800 MG PO TABS: 800.0000 mg | ORAL_TABLET | Freq: Three times a day (TID) | ORAL | 0 refills | Status: DC | PRN

## 2021-09-12 MED ORDER — DOXYCYCLINE HYCLATE 100 MG PO CAPS: 100.0000 mg | ORAL_CAPSULE | Freq: Two times a day (BID) | ORAL | 0 refills | Status: AC

## 2021-09-12 MED ORDER — DOXYCYCLINE HYCLATE 100 MG PO CAPS: 100.0000 mg | ORAL_CAPSULE | Freq: Two times a day (BID) | ORAL | 0 refills | Status: DC

## 2021-09-12 MED ORDER — IBUPROFEN 800 MG PO TABS: 800.0000 mg | ORAL_TABLET | Freq: Three times a day (TID) | ORAL | 0 refills | Status: AC | PRN

## 2021-09-12 NOTE — ED Provider Notes (Signed)
[facility] EMERGENCY DEPT Provider Note   CSN: [id number] Arrival date & time: 09/12/21  1722     History {Add pertinent medical, surgical, social history, OB history to HPI:1} Chief Complaint  Patient presents with   Joint Swelling    Luke [name] is a 41 y.o. male.  He is right-hand dominant.  Works as a Teaching laboratory technician.  Complaining of 3 to 4 days of increased right elbow pain.  No known trauma.  Worse with bending and flexing.  No fevers no numbness no weakness.  Has not tried anything for it.  The history is provided by the patient.  Arm Injury Location:  Elbow Elbow location:  R elbow Injury: no   Pain details:    Quality:  Throbbing   Severity:  Severe   Onset quality:  Gradual   Duration:  4 days   Timing:  Constant   Progression:  Worsening Handedness:  Right-handed Relieved by:  Nothing Worsened by:  Movement Ineffective treatments:  None tried Associated symptoms: swelling   Associated symptoms: no decreased range of motion, no fever, no neck pain, no numbness and no tingling       Home Medications Prior to Admission medications   Medication Sig Start Date End Date Taking? Authorizing Provider  ALPRAZolam Prudy Feeler) 1 MG tablet Take 1 mg by mouth daily.     [provider]  amphetamine-dextroamphetamine (ADDERALL XR) 10 MG 24 hr capsule Take 10 mg by mouth daily.    [provider]  azithromycin (ZITHROMAX) 250 MG tablet Take 2 tablets today then one tablet on day 2 until complete 01/21/16   Doristine Bosworth, MD  Buprenorphine HCl-Naloxone HCl (ZUBSOLV) 5.7-1.4 MG SUBL Place 0.5-1 each under the tongue 2 (two) times daily.     [provider]  DULoxetine (CYMBALTA) 60 MG capsule Take 60 mg by mouth daily.    [provider]  fluticasone (FLONASE) 50 MCG/ACT nasal spray Place 2 sprays into both nostrils daily. 01/20/16   Doristine Bosworth, MD  ibuprofen (ADVIL,MOTRIN) 200 MG tablet Take 400 mg by mouth every 6 (six)  hours as needed for fever (pain).    [provider]      Allergies    Augmentin [amoxicillin-pot clavulanate]    Review of Systems   Review of Systems  Constitutional:  Negative for fever.  Musculoskeletal:  Negative for neck pain.  Skin:  Negative for wound.   Physical Exam Updated Vital Signs BP (!) 136/95 (BP Location: Left Arm)   Pulse 79   Temp 98.4 F (36.9 C) (Oral)   Resp 16   SpO2 100%  Physical Exam Vitals and nursing note reviewed.  Constitutional:      Appearance: Normal appearance. He is well-developed.  HENT:     Head: Normocephalic and atraumatic.  Eyes:     Conjunctiva/sclera: Conjunctivae normal.  Pulmonary:     Effort: Pulmonary effort is normal.  Musculoskeletal:        General: Tenderness present. No deformity. Normal range of motion.     Cervical back: Neck supple.     Comments: Right x-ray with some posterior swelling and tenderness with overlying erythema.  Full range of motion of joint.  Distal neurovascular intact.  No significant thickening of the skin consistent with a bursitis.  Skin:    General: Skin is warm and dry.  Neurological:     General: No focal deficit present.     Mental Status: He is alert.  GCS: GCS eye subscore is 4. GCS verbal subscore is 5. GCS motor subscore is 6.     Sensory: No sensory deficit.     Motor: No weakness.    ED Results / Procedures / Treatments   Labs (all labs ordered are listed, but only abnormal results are displayed) Labs Reviewed  CBC - Abnormal; Notable for the following components:      Result Value   RBC 4.18 (*)    MCV 101.0 (*)    All other components within normal limits  BASIC METABOLIC PANEL - Abnormal; Notable for the following components:   Glucose, Bld 109 (*)    BUN 23 (*)    All other components within normal limits    EKG None  Radiology No results found.  Procedures Procedures  {Document cardiac monitor, telemetry assessment procedure when  appropriate:1}  Medications Ordered in ED Medications - No data to display  ED Course/ Medical Decision Making/ A&P                           Medical Decision Making Amount and/or Complexity of Data Reviewed Labs: ordered. Radiology: ordered.   This patient complains of ***; this involves an extensive number of treatment Options and is a complaint that carries with it a high risk of complications and morbidity. The differential includes ***  I ordered, reviewed and interpreted labs, which included *** I ordered medication *** and reviewed PMP when indicated. I ordered imaging studies which included *** and I independently    visualized and interpreted imaging which showed *** Additional history obtained from *** Previous records obtained and reviewed *** I consulted *** and discussed lab and imaging findings and discussed disposition.  Cardiac monitoring reviewed, *** Social determinants considered, *** Critical Interventions: ***  After the interventions stated above, I reevaluated the patient and found *** Admission and further testing considered, ***   {Document critical care time when appropriate:1} {Document review of labs and clinical decision tools ie heart score, Chads2Vasc2 etc:1}  {Document your independent review of radiology images, and any outside records:1} {Document your discussion with family members, caretakers, and with consultants:1} {Document social determinants of health affecting pt's care:1} {Document your decision making why or why not admission, treatments were needed:1} Final Clinical Impression(s) / ED Diagnoses Final diagnoses:  None    Rx / DC Orders ED Discharge Orders     None

## 2021-09-12 NOTE — Discharge Instructions (Signed)
You were seen in the emergency department for right elbow pain and redness.  Your x-ray did not show any obvious fracture or dislocation.  We are putting you on some anti-inflammatories sling and antibiotics.  Please contact Dr. Greta Doom for follow-up.  Return if any worsening or concerning symptoms.

## 2021-09-12 NOTE — ED Triage Notes (Signed)
Pt is here for increasing pain and swelling in right elbow.  Elbow is red and swollen and painful.  Not associated with any trauma that pt is aware of, has been getting worse for a few days.

## 2022-11-07 IMAGING — DX DG ELBOW COMPLETE 3+V*R*
4 series · 4 of 4 positions shown · non-contrast
Comparison: None Available.

CLINICAL DATA: swollen and red

EXAM:
RIGHT ELBOW - COMPLETE 3+ VIEW

[elbow ap]
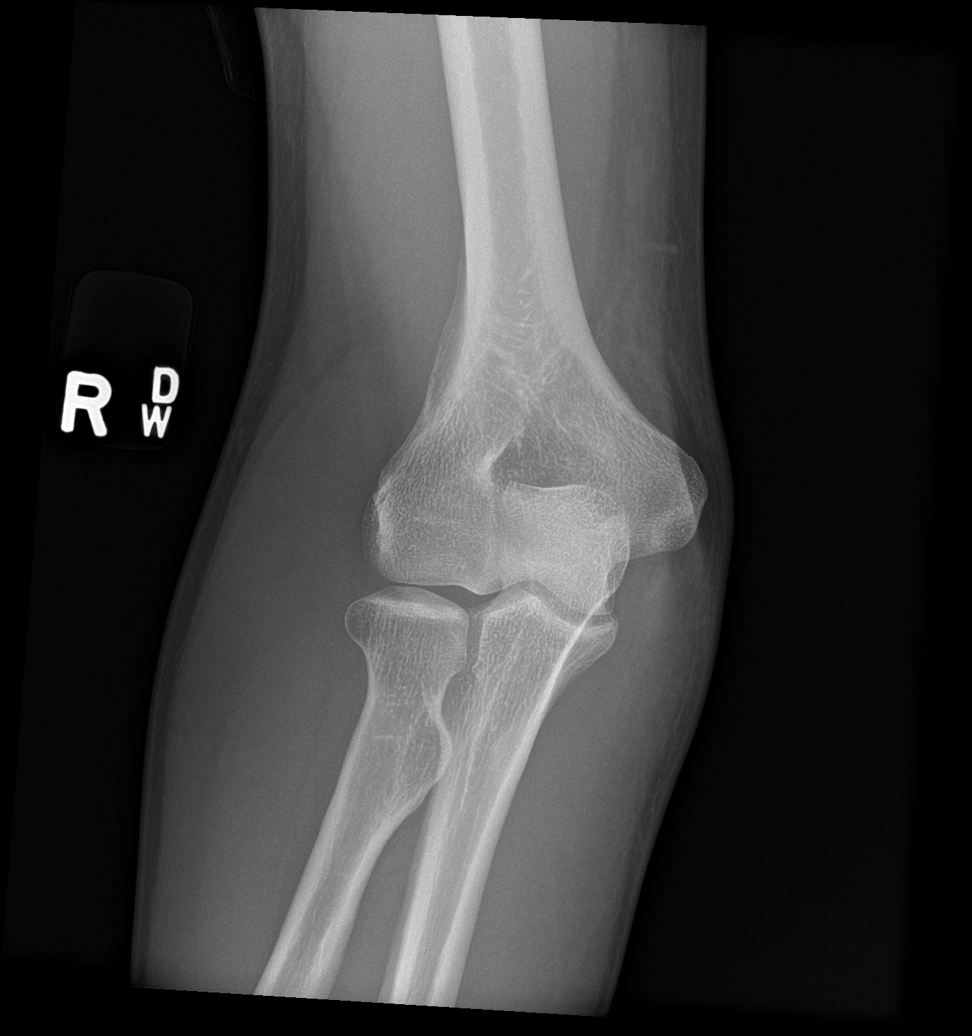

[elbow obl (1 of 2)]
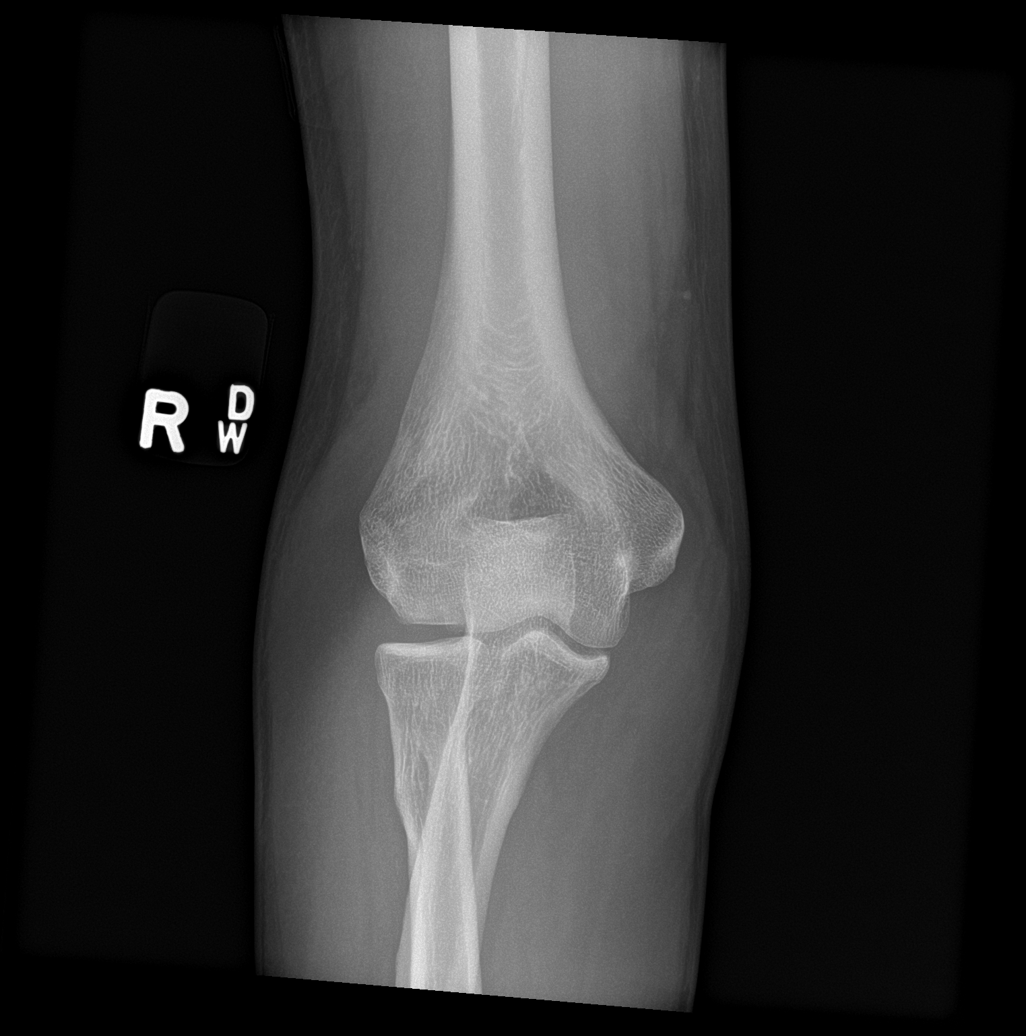

[elbow obl (2 of 2)]
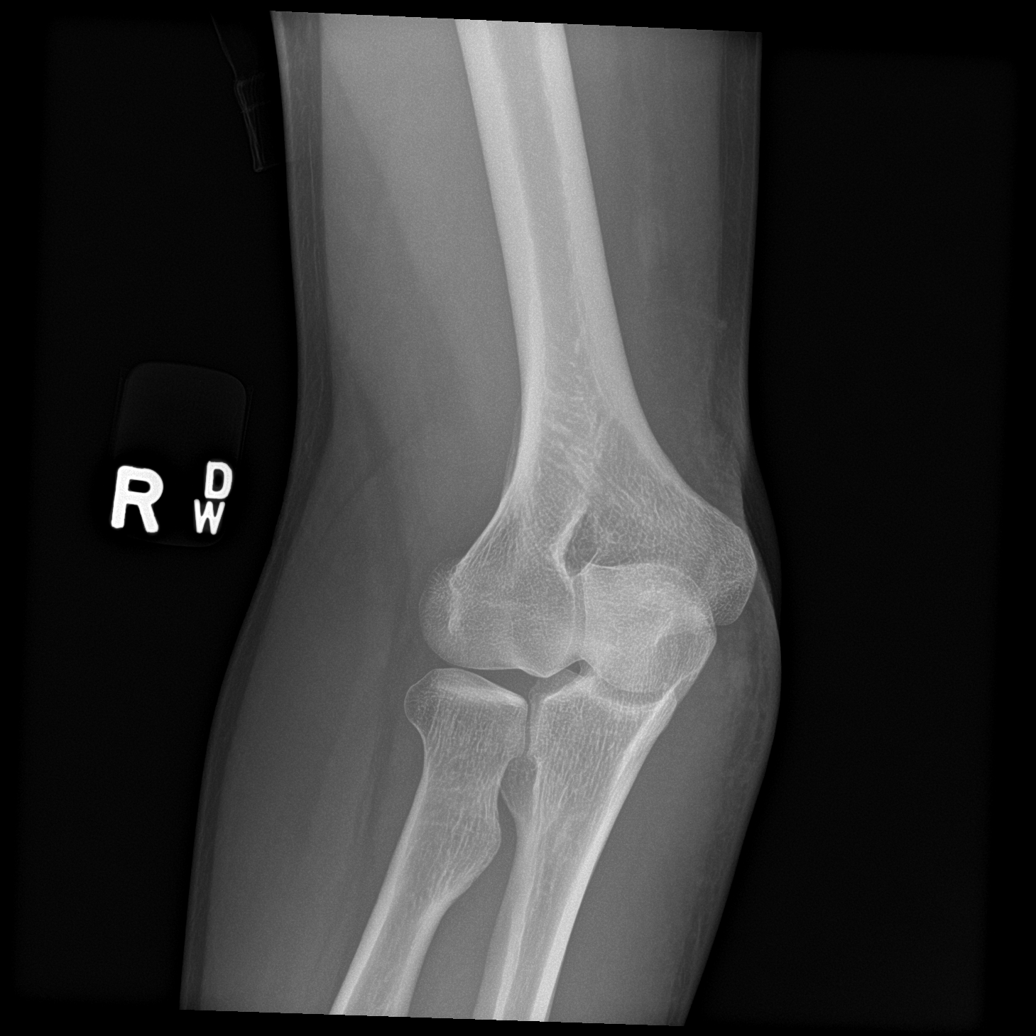

[elbow lat]
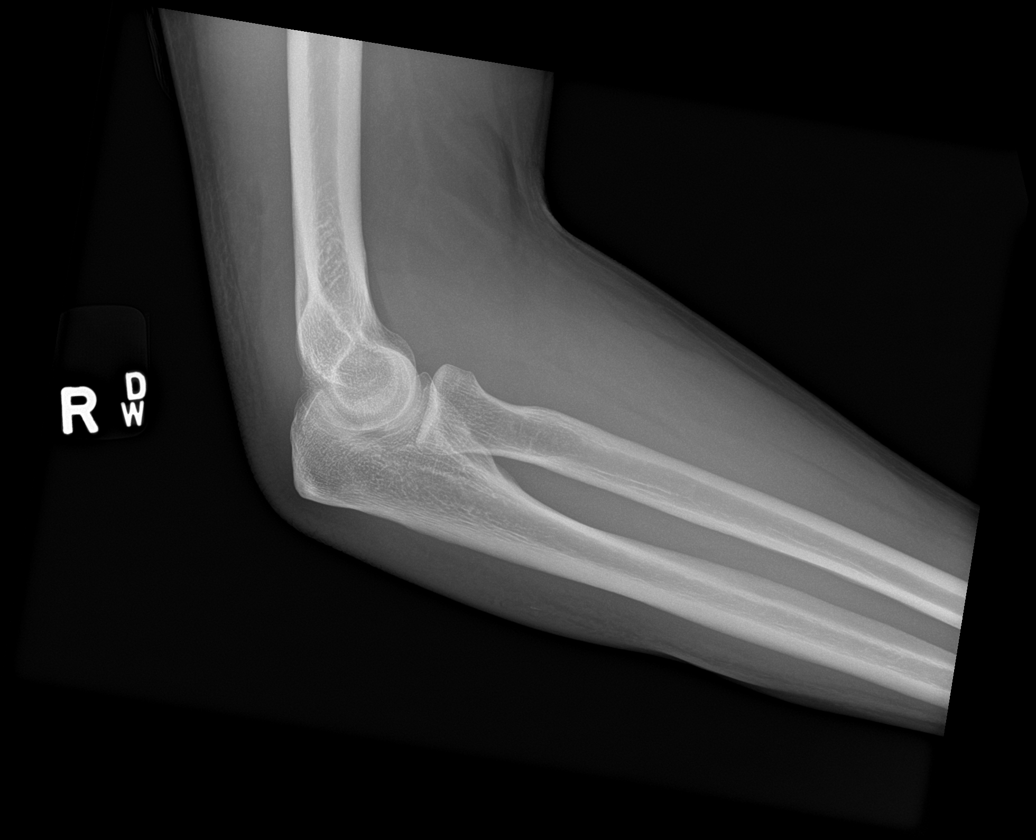

[4 of 4 positions shown; findings below may reference images not displayed]

FINDINGS: There is no evidence of fracture, dislocation, or joint effusion.
There is no evidence of arthropathy or other focal bone abnormality.
Mild subcutaneus soft tissue edema of the posteromedial elbow.
IMPRESSION: No acute displaced fracture or dislocation.
